# Patient Record
Sex: Male | Born: 1940 | Race: White | Hispanic: No | Marital: Married | Smoking: Never smoker
Health system: Southern US, Community
[De-identification: ages and names within clinical notes are randomized; demographics above are authoritative.]

## PROBLEM LIST (undated history)

## (undated) DIAGNOSIS — C801 Malignant (primary) neoplasm, unspecified: Secondary | ICD-10-CM

## (undated) DIAGNOSIS — C61 Malignant neoplasm of prostate: Secondary | ICD-10-CM

## (undated) DIAGNOSIS — N189 Chronic kidney disease, unspecified: Secondary | ICD-10-CM

## (undated) DIAGNOSIS — G4730 Sleep apnea, unspecified: Secondary | ICD-10-CM

## (undated) DIAGNOSIS — I499 Cardiac arrhythmia, unspecified: Secondary | ICD-10-CM

## (undated) DIAGNOSIS — C4490 Unspecified malignant neoplasm of skin, unspecified: Secondary | ICD-10-CM

## (undated) DIAGNOSIS — I4892 Unspecified atrial flutter: Secondary | ICD-10-CM

## (undated) DIAGNOSIS — I10 Essential (primary) hypertension: Secondary | ICD-10-CM

## (undated) HISTORY — PX: KNEE SURGERY: SHX244

## (undated) HISTORY — PX: CARDIOVERSION: SHX1299

## (undated) HISTORY — PX: CARDIAC CATHETERIZATION: SHX172

## (undated) HISTORY — PX: PROSTATE SURGERY: SHX751

---

## 2000-04-15 ENCOUNTER — Ambulatory Visit (HOSPITAL_COMMUNITY): Admission: RE | Admit: 2000-04-15 | Discharge: 2000-04-15 | Payer: Self-pay | Admitting: Urology

## 2000-04-15 ENCOUNTER — Encounter: Payer: Self-pay | Admitting: Urology

## 2000-07-22 ENCOUNTER — Ambulatory Visit (HOSPITAL_COMMUNITY): Admission: RE | Admit: 2000-07-22 | Discharge: 2000-07-22 | Payer: Self-pay | Admitting: Urology

## 2011-02-02 DIAGNOSIS — C61 Malignant neoplasm of prostate: Secondary | ICD-10-CM | POA: Insufficient documentation

## 2011-02-02 DIAGNOSIS — C4492 Squamous cell carcinoma of skin, unspecified: Secondary | ICD-10-CM | POA: Insufficient documentation

## 2011-02-02 DIAGNOSIS — C4491 Basal cell carcinoma of skin, unspecified: Secondary | ICD-10-CM | POA: Insufficient documentation

## 2011-06-22 DIAGNOSIS — M159 Polyosteoarthritis, unspecified: Secondary | ICD-10-CM | POA: Insufficient documentation

## 2012-04-26 DIAGNOSIS — R9431 Abnormal electrocardiogram [ECG] [EKG]: Secondary | ICD-10-CM | POA: Insufficient documentation

## 2015-04-24 DIAGNOSIS — R600 Localized edema: Secondary | ICD-10-CM | POA: Insufficient documentation

## 2015-06-24 DIAGNOSIS — K5730 Diverticulosis of large intestine without perforation or abscess without bleeding: Secondary | ICD-10-CM | POA: Insufficient documentation

## 2015-06-24 DIAGNOSIS — K635 Polyp of colon: Secondary | ICD-10-CM | POA: Insufficient documentation

## 2015-06-24 DIAGNOSIS — G4733 Obstructive sleep apnea (adult) (pediatric): Secondary | ICD-10-CM | POA: Insufficient documentation

## 2015-10-07 DIAGNOSIS — C44111 Basal cell carcinoma of skin of unspecified eyelid, including canthus: Secondary | ICD-10-CM | POA: Insufficient documentation

## 2016-01-19 DIAGNOSIS — F5101 Primary insomnia: Secondary | ICD-10-CM | POA: Insufficient documentation

## 2016-07-26 DIAGNOSIS — M5116 Intervertebral disc disorders with radiculopathy, lumbar region: Secondary | ICD-10-CM | POA: Insufficient documentation

## 2016-08-10 DIAGNOSIS — F419 Anxiety disorder, unspecified: Secondary | ICD-10-CM | POA: Insufficient documentation

## 2017-02-01 DIAGNOSIS — N183 Chronic kidney disease, stage 3 unspecified: Secondary | ICD-10-CM | POA: Insufficient documentation

## 2017-02-16 DIAGNOSIS — I4892 Unspecified atrial flutter: Secondary | ICD-10-CM | POA: Insufficient documentation

## 2017-03-29 DIAGNOSIS — I480 Paroxysmal atrial fibrillation: Secondary | ICD-10-CM | POA: Insufficient documentation

## 2017-04-08 DIAGNOSIS — D219 Benign neoplasm of connective and other soft tissue, unspecified: Secondary | ICD-10-CM | POA: Insufficient documentation

## 2017-04-08 DIAGNOSIS — H43819 Vitreous degeneration, unspecified eye: Secondary | ICD-10-CM | POA: Insufficient documentation

## 2017-11-24 DIAGNOSIS — S83242A Other tear of medial meniscus, current injury, left knee, initial encounter: Secondary | ICD-10-CM | POA: Insufficient documentation

## 2019-05-17 DIAGNOSIS — I493 Ventricular premature depolarization: Secondary | ICD-10-CM | POA: Insufficient documentation

## 2019-05-17 DIAGNOSIS — I517 Cardiomegaly: Secondary | ICD-10-CM | POA: Insufficient documentation

## 2019-06-27 DIAGNOSIS — N2889 Other specified disorders of kidney and ureter: Secondary | ICD-10-CM | POA: Insufficient documentation

## 2019-08-01 ENCOUNTER — Other Ambulatory Visit (HOSPITAL_COMMUNITY): Payer: Self-pay | Admitting: Urology

## 2019-08-01 ENCOUNTER — Other Ambulatory Visit: Payer: Self-pay | Admitting: Urology

## 2019-08-01 DIAGNOSIS — D49512 Neoplasm of unspecified behavior of left kidney: Secondary | ICD-10-CM

## 2019-08-01 DIAGNOSIS — C763 Malignant neoplasm of pelvis: Secondary | ICD-10-CM

## 2019-08-09 ENCOUNTER — Ambulatory Visit (HOSPITAL_COMMUNITY)
Admission: RE | Admit: 2019-08-09 | Discharge: 2019-08-09 | Disposition: A | Discharge status: Home / Self Care | Payer: Medicare Other | Source: Ambulatory Visit | Attending: Urology | Admitting: Urology

## 2019-08-09 ENCOUNTER — Other Ambulatory Visit: Payer: Self-pay

## 2019-08-09 DIAGNOSIS — D49512 Neoplasm of unspecified behavior of left kidney: Secondary | ICD-10-CM | POA: Insufficient documentation

## 2019-08-09 DIAGNOSIS — C763 Malignant neoplasm of pelvis: Secondary | ICD-10-CM

## 2019-08-09 MED ORDER — GADOBUTROL 1 MMOL/ML IV SOLN
8.0000 mL | Freq: Once | INTRAVENOUS | Status: AC | PRN
  Administered 2019-08-09: 19:00:00 8 mL via INTRAVENOUS

## 2019-08-20 ENCOUNTER — Other Ambulatory Visit (HOSPITAL_COMMUNITY): Payer: Self-pay | Admitting: Urology

## 2019-08-20 DIAGNOSIS — D3002 Benign neoplasm of left kidney: Secondary | ICD-10-CM

## 2019-08-21 ENCOUNTER — Other Ambulatory Visit (HOSPITAL_COMMUNITY): Payer: Self-pay | Admitting: Urology

## 2019-08-21 ENCOUNTER — Encounter (HOSPITAL_COMMUNITY): Payer: Self-pay

## 2019-08-21 DIAGNOSIS — D3002 Benign neoplasm of left kidney: Secondary | ICD-10-CM

## 2019-08-21 NOTE — Progress Notes (Signed)
Darrell Cook Male, 78 y.o., [birth year] MRN:  [id number] Phone:  [phone] (H) PCP:  Eber Hong, MD Primary Cvg:  Medicare/Medicare Part A And B Next Appt With Radiology ([facility]-CT 3) 08/29/2019 at 8:00 AM  RE: Biopsy Received: Today Message Contents  Arne Cleveland, MD  [name]    CT core L renal mass    [facility]   Previous Messages  ----- Message -----  From: Lenore Cordia  Sent: 08/20/2019  5:13 PM EST  To: Ir Procedure Requests  Subject: Biopsy                      Procedure Requested: CT or US Biopsy    Reason for Procedure: Percutaneous biopsy of left renal mass with Ultrasound or CT to evaluate for malignancy    Provider Requesting: Dr Raynelle Bring  Provider Telephone: [phone]    Other Info: Rad exams in Epic

## 2019-08-28 ENCOUNTER — Other Ambulatory Visit: Payer: Self-pay | Admitting: Radiology

## 2019-08-29 ENCOUNTER — Ambulatory Visit (HOSPITAL_COMMUNITY)
Admission: RE | Admit: 2019-08-29 | Discharge: 2019-08-29 | Disposition: A | Discharge status: Home / Self Care | Payer: Medicare Other | Source: Ambulatory Visit | Attending: Urology | Admitting: Urology

## 2019-08-29 ENCOUNTER — Other Ambulatory Visit: Payer: Self-pay

## 2019-08-29 ENCOUNTER — Encounter (HOSPITAL_COMMUNITY): Payer: Self-pay

## 2019-08-29 DIAGNOSIS — I10 Essential (primary) hypertension: Secondary | ICD-10-CM | POA: Diagnosis not present

## 2019-08-29 DIAGNOSIS — D3002 Benign neoplasm of left kidney: Secondary | ICD-10-CM

## 2019-08-29 DIAGNOSIS — I700 Atherosclerosis of aorta: Secondary | ICD-10-CM | POA: Insufficient documentation

## 2019-08-29 DIAGNOSIS — Z85828 Personal history of other malignant neoplasm of skin: Secondary | ICD-10-CM | POA: Diagnosis not present

## 2019-08-29 DIAGNOSIS — Z8546 Personal history of malignant neoplasm of prostate: Secondary | ICD-10-CM | POA: Diagnosis not present

## 2019-08-29 DIAGNOSIS — I4892 Unspecified atrial flutter: Secondary | ICD-10-CM | POA: Insufficient documentation

## 2019-08-29 DIAGNOSIS — G4730 Sleep apnea, unspecified: Secondary | ICD-10-CM | POA: Insufficient documentation

## 2019-08-29 DIAGNOSIS — C642 Malignant neoplasm of left kidney, except renal pelvis: Secondary | ICD-10-CM | POA: Insufficient documentation

## 2019-08-29 HISTORY — DX: Unspecified atrial flutter: I48.92

## 2019-08-29 HISTORY — DX: Malignant (primary) neoplasm, unspecified: C80.1

## 2019-08-29 HISTORY — DX: Malignant neoplasm of prostate: C61

## 2019-08-29 HISTORY — DX: Sleep apnea, unspecified: G47.30

## 2019-08-29 HISTORY — DX: Unspecified malignant neoplasm of skin, unspecified: C44.90

## 2019-08-29 HISTORY — DX: Essential (primary) hypertension: I10

## 2019-08-29 LAB — CBC
HCT: 45.7 % (ref 39.0–52.0)
Hemoglobin: 15.5 g/dL (ref 13.0–17.0)
MCH: 32.5 pg (ref 26.0–34.0)
MCHC: 33.9 g/dL (ref 30.0–36.0)
MCV: 95.8 fL (ref 80.0–100.0)
Platelets: 164 10*3/uL (ref 150–400)
RBC: 4.77 MIL/uL (ref 4.22–5.81)
RDW: 12.3 % (ref 11.5–15.5)
WBC: 6.3 10*3/uL (ref 4.0–10.5)
nRBC: 0 % (ref 0.0–0.2)

## 2019-08-29 LAB — PROTIME-INR
INR: 1 (ref 0.8–1.2)
Prothrombin Time: 13 seconds (ref 11.4–15.2)

## 2019-08-29 MED ORDER — MIDAZOLAM HCL 2 MG/2ML IJ SOLN
INTRAMUSCULAR | Status: AC
  Filled 2019-08-29: qty 2

## 2019-08-29 MED ORDER — SODIUM CHLORIDE 0.9 % IV SOLN: INTRAVENOUS | Status: DC

## 2019-08-29 MED ORDER — HYDROCODONE-ACETAMINOPHEN 5-325 MG PO TABS: 1.0000 | ORAL_TABLET | ORAL | Status: DC | PRN

## 2019-08-29 MED ORDER — GELATIN ABSORBABLE 12-7 MM EX MISC
CUTANEOUS | Status: AC
  Filled 2019-08-29: qty 1

## 2019-08-29 MED ORDER — LIDOCAINE HCL 1 % IJ SOLN
INTRAMUSCULAR | Status: AC
  Filled 2019-08-29: qty 20

## 2019-08-29 MED ORDER — SODIUM CHLORIDE 0.9 % IV SOLN
INTRAVENOUS | Status: AC | PRN
  Administered 2019-08-29: 10 mL/h via INTRAVENOUS

## 2019-08-29 MED ORDER — FENTANYL CITRATE (PF) 100 MCG/2ML IJ SOLN
INTRAMUSCULAR | Status: AC | PRN
  Administered 2019-08-29: 50 ug via INTRAVENOUS

## 2019-08-29 MED ORDER — FENTANYL CITRATE (PF) 100 MCG/2ML IJ SOLN
INTRAMUSCULAR | Status: AC
  Filled 2019-08-29: qty 4

## 2019-08-29 NOTE — H&P (Signed)
Chief Complaint: Patient was seen in consultation today for left renal mass biopsy at the request of [place]  Referring Physician(s): Borden,Lester  Supervising Physician: Arne Cleveland  Patient Status: [facility] - Out-pt  History of Present Illness: Darrell Cook is a 78 y.o. male   Retired Education officer, environmental Hx Prostate cancer 1995 Developed rib pain and was seen by Primary MD few months ago Imaging revealed incidental Left renal mass  MR 08/09/19: IMPRESSION: 1. 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 No tumor thrombus in the left renal vein or appreciable adenopathy. 2. Enhancing bony pelvic lesions are roughly stable in size compared to the pelvic MRI of 08/04/2016, and may represent residua of prior metastatic disease; given the prostatectomy, previous prostate cancer lesions are suggested. The lack of significant progression may indicate effectively treated disease, but these lesions do continue to mildly enhance. 3. Prostatectomy noted with urethral sphincter implant. 4. Mild cardiomegaly. 5. Numerous renal cysts of varying size and complexity, but without definite enhancement. Many of these are technically too small to characterize. 6. Descending and sigmoid colon diverticulosis. 7.  Aortic Atherosclerosis (ICD10-I70.0). 8. Lumbar spondylosis and degenerative disc disease.  Referred to Dr Windle Guard Scheduled now for left renal mass biopsy    Past Medical History:  Diagnosis Date  . Atrial flutter ([place])   . Cancer ([place])   . Hypertension   . Prostate CA ([place])   . Skin cancer   . Sleep apnea     Past Surgical History:  Procedure Laterality Date  . CARDIAC CATHETERIZATION    . CARDIOVERSION    . KNEE SURGERY    . PROSTATE SURGERY      Allergies: Other, Benzodiazepines, and Magnesium  Medications: Prior to Admission medications   Not on File     History reviewed. No pertinent family  history.  Social History   Socioeconomic History  . Marital status: Married    Spouse name: Not on file  . Number of children: Not on file  . Years of education: Not on file  . Highest education level: Not on file  Occupational History  . Not on file  Tobacco Use  . Smoking status: Never Smoker  . Smokeless tobacco: Never Used  Substance and Sexual Activity  . Alcohol use: Not Currently  . Drug use: Never  . Sexual activity: Not on file  Other Topics Concern  . Not on file  Social History Narrative  . Not on file   Social Determinants of Health   Financial Resource Strain:   . Difficulty of Paying Living Expenses: Not on file  Food Insecurity:   . Worried About Charity fundraiser in the Last Year: Not on file  . Ran Out of Food in the Last Year: Not on file  Transportation Needs:   . Lack of Transportation (Medical): Not on file  . Lack of Transportation (Non-Medical): Not on file  Physical Activity:   . Days of Exercise per Week: Not on file  . Minutes of Exercise per Session: Not on file  Stress:   . Feeling of Stress : Not on file  Social Connections:   . Frequency of Communication with Friends and Family: Not on file  . Frequency of Social Gatherings with Friends and Family: Not on file  . Attends Religious Services: Not on file  . Active Member of Clubs or Organizations: Not on file  . Attends Archivist Meetings: Not  on file  . Marital Status: Not on file    Review of Systems: A 12 point ROS discussed and pertinent positives are indicated in the HPI above.  All other systems are negative.  Review of Systems  Constitutional: Negative for activity change, fatigue and fever.  Respiratory: Negative for cough and shortness of breath.   Cardiovascular: Negative for chest pain.  Gastrointestinal: Negative for abdominal pain.  Musculoskeletal: Negative for back pain and gait problem.  Neurological: Negative for weakness.  Psychiatric/Behavioral:  Negative for behavioral problems and confusion.    Vital Signs: BP (!) 157/86   Pulse (!) 50   Temp 97.9 F (36.6 C) (Oral)   Resp 16   Ht 5\' 11"  (1.803 m)   Wt 190 lb (86.2 kg)   SpO2 100%   BMI 26.50 kg/m   Physical Exam Vitals reviewed.  Cardiovascular:     Rate and Rhythm: Normal rate and regular rhythm.     Heart sounds: Normal heart sounds.  Pulmonary:     Effort: Pulmonary effort is normal.     Breath sounds: Normal breath sounds.  Abdominal:     Palpations: Abdomen is soft.     Tenderness: There is no abdominal tenderness.  Musculoskeletal:        General: Normal range of motion.  Skin:    General: Skin is warm and dry.  Neurological:     Mental Status: He is alert and oriented to person, place, and time.  Psychiatric:        Mood and Affect: Mood normal.        Behavior: Behavior normal.        Thought Content: Thought content normal.        Judgment: Judgment normal.     Imaging: MR PELVIS W WO CONTRAST  Result Date: 08/10/2019 CLINICAL DATA:  Left renal malignancy. EXAM: MRI ABDOMEN AND PELVIS WITHOUT AND WITH CONTRAST TECHNIQUE: Multiplanar multisequence MR imaging of the abdomen and pelvis was performed both before and after the administration of intravenous contrast. CONTRAST:  70mL GADAVIST GADOBUTROL 1 MMOL/ML IV SOLN COMPARISON:  Outside CT abdomen 06/21/2019 and outside MRI pelvis from 08/04/2016 FINDINGS: COMBINED FINDINGS FOR BOTH MR ABDOMEN AND PELVIS Despite efforts by the technologist and patient, motion artifact is present on today's exam and could not be eliminated. This reduces exam sensitivity and specificity. Lower chest: Mild cardiomegaly. Hepatobiliary: Unremarkable Pancreas:  Unremarkable Spleen:  Unremarkable Adrenals/Urinary Tract:  The adrenal glands appear unremarkable. In the left mid kidney a 4.0 by 3.6 by 4.4 cm solid enhancing mass has mixed T2 signal characteristics and mixed T1 signal characteristics. No internal fat density within  this lesion on prior CT or on today's MRI. No appreciable tumor thrombus in the left renal vein. Numerous small additional cysts of varying complexity are scattered throughout both kidneys. Some of these have high precontrast T1 signal characteristics and many are technically too small to characterize, but no other specific worrisome enhancing lesions are identified. Metal artifact in the vicinity of the small caliber urinary bladder, correlate with operative history. Stomach/Bowel: Descending and sigmoid colon diverticulosis. Vascular/Lymphatic: Aortoiliac atherosclerotic vascular disease. No pathologic adenopathy is identified. Reproductive: Prostatectomy. Urethral sphincter implant noted with reservoir in the right lower quadrant. Other:  No supplemental non-categorized findings. Musculoskeletal: An enhancing right iliac bone lesion measuring 1.5 cm in diameter on image 16/6 previously measured 1.3 cm in diameter on 08/04/2016. Enhancing 4.8 by 1.9 cm left sacral T2 hyperintense lesion on image 10/6 appears roughly stable.  An enhancing 2.0 by 1.1 cm T2 hyperintense lesion of the left proximal femur on image 36/6 of the pelvic MRI previously measured 1.9 by 1.4 cm. Small cystic lesions along the margins of the femoral heads noted. Dextroconvex lumbar scoliosis with spondylosis and degenerative disc disease. Degenerative endplate findings eccentric to the left at the L2-3 level. IMPRESSION: 1. 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 No tumor thrombus in the left renal vein or appreciable adenopathy. 2. Enhancing bony pelvic lesions are roughly stable in size compared to the pelvic MRI of 08/04/2016, and may represent residua of prior metastatic disease; given the prostatectomy, previous prostate cancer lesions are suggested. The lack of significant progression may indicate effectively treated disease, but these lesions do continue to mildly enhance. 3. Prostatectomy  noted with urethral sphincter implant. 4. Mild cardiomegaly. 5. Numerous renal cysts of varying size and complexity, but without definite enhancement. Many of these are technically too small to characterize. 6. Descending and sigmoid colon diverticulosis. 7.  Aortic Atherosclerosis (ICD10-I70.0). 8. Lumbar spondylosis and degenerative disc disease. Electronically Signed   By: Van Clines M.D.   On: 08/10/2019 08:17   MR ABDOMEN WWO CONTRAST  Result Date: 08/10/2019 CLINICAL DATA:  Left renal malignancy. EXAM: MRI ABDOMEN AND PELVIS WITHOUT AND WITH CONTRAST TECHNIQUE: Multiplanar multisequence MR imaging of the abdomen and pelvis was performed both before and after the administration of intravenous contrast. CONTRAST:  68mL GADAVIST GADOBUTROL 1 MMOL/ML IV SOLN COMPARISON:  Outside CT abdomen 06/21/2019 and outside MRI pelvis from 08/04/2016 FINDINGS: COMBINED FINDINGS FOR BOTH MR ABDOMEN AND PELVIS Despite efforts by the technologist and patient, motion artifact is present on today's exam and could not be eliminated. This reduces exam sensitivity and specificity. Lower chest: Mild cardiomegaly. Hepatobiliary: Unremarkable Pancreas:  Unremarkable Spleen:  Unremarkable Adrenals/Urinary Tract:  The adrenal glands appear unremarkable. In the left mid kidney a 4.0 by 3.6 by 4.4 cm solid enhancing mass has mixed T2 signal characteristics and mixed T1 signal characteristics. No internal fat density within this lesion on prior CT or on today's MRI. No appreciable tumor thrombus in the left renal vein. Numerous small additional cysts of varying complexity are scattered throughout both kidneys. Some of these have high precontrast T1 signal characteristics and many are technically too small to characterize, but no other specific worrisome enhancing lesions are identified. Metal artifact in the vicinity of the small caliber urinary bladder, correlate with operative history. Stomach/Bowel: Descending and sigmoid colon  diverticulosis. Vascular/Lymphatic: Aortoiliac atherosclerotic vascular disease. No pathologic adenopathy is identified. Reproductive: Prostatectomy. Urethral sphincter implant noted with reservoir in the right lower quadrant. Other:  No supplemental non-categorized findings. Musculoskeletal: An enhancing right iliac bone lesion measuring 1.5 cm in diameter on image 16/6 previously measured 1.3 cm in diameter on 08/04/2016. Enhancing 4.8 by 1.9 cm left sacral T2 hyperintense lesion on image 10/6 appears roughly stable. An enhancing 2.0 by 1.1 cm T2 hyperintense lesion of the left proximal femur on image 36/6 of the pelvic MRI previously measured 1.9 by 1.4 cm. Small cystic lesions along the margins of the femoral heads noted. Dextroconvex lumbar scoliosis with spondylosis and degenerative disc disease. Degenerative endplate findings eccentric to the left at the L2-3 level. IMPRESSION: 1. 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 No tumor thrombus in the left renal vein or appreciable adenopathy. 2. Enhancing bony pelvic lesions are roughly stable in size compared to  the pelvic MRI of 08/04/2016, and may represent residua of prior metastatic disease; given the prostatectomy, previous prostate cancer lesions are suggested. The lack of significant progression may indicate effectively treated disease, but these lesions do continue to mildly enhance. 3. Prostatectomy noted with urethral sphincter implant. 4. Mild cardiomegaly. 5. Numerous renal cysts of varying size and complexity, but without definite enhancement. Many of these are technically too small to characterize. 6. Descending and sigmoid colon diverticulosis. 7.  Aortic Atherosclerosis (ICD10-I70.0). 8. Lumbar spondylosis and degenerative disc disease. Electronically Signed   By: Van Clines M.D.   On: 08/10/2019 08:17    Labs:  CBC: No results for input(s): WBC, HGB, HCT, PLT in the last 8760  hours.  COAGS: No results for input(s): INR, APTT in the last 8760 hours.  BMP: No results for input(s): NA, K, CL, CO2, GLUCOSE, BUN, CALCIUM, CREATININE, GFRNONAA, GFRAA in the last 8760 hours.  Invalid input(s): CMP  LIVER FUNCTION TESTS: No results for input(s): BILITOT, AST, ALT, ALKPHOS, PROT, ALBUMIN in the last 8760 hours.  TUMOR MARKERS: No results for input(s): AFPTM, CEA, CA199, CHROMGRNA in the last 8760 hours.  Assessment and Plan:  Left renal mass Scheduled for biopsy of same Risks and benefits of left renal mass biopsy was discussed with the patient and/or patient's family including, but not limited to bleeding, infection, damage to adjacent structures or low yield requiring additional tests.  All of the questions were answered and there is agreement to proceed. Consent signed and in chart.   Thank you for this interesting consult.  I greatly enjoyed meeting Darrell Cook and look forward to participating in their care.  A copy of this report was sent to the requesting provider on this date.  Electronically Signed: Lavonia Drafts, PA-C 08/29/2019, 6:54 AM   I spent a total of  30 Minutes   in face to face in clinical consultation, greater than 50% of which was counseling/coordinating care for left renal mass biopsy

## 2019-08-29 NOTE — Discharge Instructions (Signed)
Percutaneous Kidney Biopsy, Care After This sheet gives you information about how to care for yourself after your procedure. Your health care provider may also give you more specific instructions. If you have problems or questions, contact your health care provider. What can I expect after the procedure? After the procedure, it is common to have: Pain or soreness near the area where the needle went through your skin (biopsy site). Bright pink or cloudy urine for 24 hours after the procedure. Follow these instructions at home: Activity Return to your normal activities as told by your health care provider. Ask your health care provider what activities are safe for you. Do not drive for 24 hours if you were given a medicine to help you relax (sedative). Do not lift anything that is heavier than 10 lb (4.5 kg) until your health care provider tells you that it is safe. Avoid activities that take a lot of effort (are strenuous) until your health care provider approves. Most people will have to wait 2 weeks before returning to activities such as exercise or sexual intercourse. General instructions  Take over-the-counter and prescription medicines only as told by your health care provider. You may eat and drink after your procedure. Follow instructions from your health care provider about eating or drinking restrictions. Check your biopsy site every day for signs of infection. Check for: More redness, swelling, or pain. More fluid or blood. Warmth. Pus or a bad smell. Keep all follow-up visits as told by your health care provider. This is important. Contact a health care provider if: You have more redness, swelling, or pain around your biopsy site. You have more fluid or blood coming from your biopsy site. Your biopsy site feels warm to the touch. You have pus or a bad smell coming from your biopsy site. You have blood in your urine more than 24 hours after your procedure. Get help right away  if: You have dark red or brown urine. You have a fever. You are unable to urinate. You feel burning when you urinate. You feel faint. You have severe pain in your abdomen or side. This information is not intended to replace advice given to you by your health care provider. Make sure you discuss any questions you have with your health care provider. Document Released: 04/25/2013 Document Revised: 08/05/2017 Document Reviewed: 06/04/2016 Elsevier Patient Education  2020 [place]. Percutaneous Kidney Biopsy  A kidney biopsy is a procedure to remove small pieces of tissue from a kidney. In a percutaneous biopsy, the tissue is removed using a needle that is inserted through the skin. This procedure is done so that the tissue can be examined under a microscope and checked for disease or infection. Tell a health care provider about:  Any allergies you have.  All medicines you are taking, including vitamins, herbs, eye drops, creams, and over-the-counter medicines.  Any problems you or family members have had with anesthetic medicines.  Any blood disorders you have.  Any surgeries you have had.  Any medical conditions you have.  Whether you are pregnant or may be pregnant. What are the risks? Generally, this is a safe procedure. However, problems may occur, including:  Infection.  Bleeding.  Allergic reactions to medicines.  Damage to other structures or organs.  Swelling from a collection of clotted blood outside a blood vessel (hematoma).  Blood in the urine (hematuria). What happens before the procedure?  Follow instructions from your health care provider about eating or drinking restrictions.  Ask your  health care provider about: ? Changing or stopping your regular medicines. This is especially important if you are taking diabetes medicines or blood thinners. ? Taking medicines such as aspirin and ibuprofen. These medicines can thin your blood. Do not take these  medicines before your procedure if your health care provider instructs you not to.  You may be given antibiotic medicine to help prevent infection.  You will have blood and urine samples taken. This is to make sure that you do not have a condition where you should not have a biopsy.  Plan to have someone take you home from the hospital or clinic.  Ask your health care provider how your biopsy site will be marked or identified. What happens during the procedure?  To lower your risk of infection: ? Your health care team will wash or sanitize their hands. ? Your skin will be washed with soap.  An IV tube will be inserted into one of your veins.  You will be given one or more of the following: ? A medicine to help you relax (sedative). ? A medicine to numb the area (local anesthetic).  You will lie on your abdomen. A firm pillow will be placed under your body to help push the kidneys closer to the surface of the skin. If you have a transplanted kidney, you will lie on your back.  The health care provider will mark the area where the needle will enter your skin.  An imaging test--such as an ultrasound, X-ray, CT scan, or MRI--will be used to locate the kidney. These images will also help the health care provider to guide the biopsy needle into the kidney.  You will be asked to hold your breath and stay still while the health care provider inserts the needle and removes the kidney tissue. ? You will need to hold your breath and stay still for 30-45 seconds. ? During the biopsy, you may hear a popping sound from the needle. ? You may also feel some pressure from the area where the needle is being inserted.  The needle may be inserted and removed 3 or 4 times to make sure that enough tissue is taken for testing.  A bandage (dressing) may be placed over the spot where the needle entered your skin (biopsy site). The procedure may vary among health care providers and hospitals. What happens  after the procedure?  Your blood pressure, heart rate, breathing rate, and blood oxygen level will be monitored until the medicines you were given have worn off.  You will need to lie on your back for 6-8 hours.  You may have some pain or soreness near the biopsy site.  You may have pink or cloudy urine from small amounts of blood. This is normal.  You may have grogginess or fatigue if you were given a sedative.  Do not drive for 24 hours if you were given a sedative.  It is up to you to get the results of your procedure. Ask your health care provider, or the department performing the procedure, when your results will be ready. This information is not intended to replace advice given to you by your health care provider. Make sure you discuss any questions you have with your health care provider. Document Released: 07/03/2004 Document Revised: 08/05/2017 Document Reviewed: 06/04/2016 Elsevier Patient Education  [place]. Moderate Conscious Sedation, Adult, Care After These instructions provide you with information about caring for yourself after your procedure. Your health care provider may also give  you more specific instructions. Your treatment has been planned according to current medical practices, but problems sometimes occur. Call your health care provider if you have any problems or questions after your procedure. What can I expect after the procedure? After your procedure, it is common:  To feel sleepy for several hours.  To feel clumsy and have poor balance for several hours.  To have poor judgment for several hours.  To vomit if you eat too soon. Follow these instructions at home: For at least 24 hours after the procedure:   Do not: ? Participate in activities where you could fall or become injured. ? Drive. ? Use heavy machinery. ? Drink alcohol. ? Take sleeping pills or medicines that cause drowsiness. ? Make important decisions or sign legal  documents. ? Take care of children on your own.  Rest. Eating and drinking  Follow the diet recommended by your health care provider.  If you vomit: ? Drink water, juice, or soup when you can drink without vomiting. ? Make sure you have little or no nausea before eating solid foods. General instructions  Have a responsible adult stay with you until you are awake and alert.  Take over-the-counter and prescription medicines only as told by your health care provider.  If you smoke, do not smoke without supervision.  Keep all follow-up visits as told by your health care provider. This is important. Contact a health care provider if:  You keep feeling nauseous or you keep vomiting.  You feel light-headed.  You develop a rash.  You have a fever. Get help right away if:  You have trouble breathing. This information is not intended to replace advice given to you by your health care provider. Make sure you discuss any questions you have with your health care provider. Document Released: 06/13/2013 Document Revised: 08/05/2017 Document Reviewed: 12/13/2015 Elsevier Patient Education  2020 [employer].

## 2019-08-29 NOTE — Procedures (Signed)
  Procedure: CT core bx L renal mass   EBL:   minimal Complications:  none immediate  See full dictation in [facility].  Dillard Cannon MD Main # [phone] Pager  [phone]

## 2019-08-30 ENCOUNTER — Other Ambulatory Visit: Payer: Self-pay

## 2019-09-04 LAB — SURGICAL PATHOLOGY

## 2019-10-01 ENCOUNTER — Other Ambulatory Visit (HOSPITAL_COMMUNITY): Payer: Medicare Other

## 2019-10-04 NOTE — Progress Notes (Signed)
Can you please place some orders for the upcoming surgery.Pt. has an appointment for PST phone interview and labs on 10/05/19. Thank you.

## 2019-10-04 NOTE — Patient Instructions (Signed)
DUE TO COVID-19 ONLY ONE VISITOR IS ALLOWED TO COME WITH YOU AND STAY IN THE WAITING ROOM ONLY DURING PRE OP AND PROCEDURE DAY OF SURGERY. THE 1 VISITOR MAY VISIT WITH YOU AFTER SURGERY IN YOUR PRIVATE ROOM DURING VISITING HOURS ONLY!  YOU NEED TO HAVE A COVID 19 TEST ON: 10/08/19 @ 10:40 am, THIS TEST MUST BE DONE BEFORE SURGERY, COME  [place], [place] , [address].  ([place]) ONCE YOUR COVID TEST IS COMPLETED, PLEASE BEGIN THE QUARANTINE INSTRUCTIONS AS OUTLINED IN YOUR HANDOUT.                Darrell Cook     Your procedure is scheduled on: 10/11/19   Report to [facility] Main  Entrance   Report to admitting at: 9:30 AM     Call this number if you have problems the morning of surgery [phone]    Remember: Do not eat food or drink liquids :After Midnight.   BRUSH YOUR TEETH MORNING OF SURGERY AND RINSE YOUR MOUTH OUT, NO CHEWING GUM CANDY OR MINTS.     Take these medicines the morning of surgery with A SIP OF WATER: omeprazole,sotolol,xanax as needed.  DO NOT TAKE ANY DIABETIC MEDICATIONS DAY OF YOUR SURGERY                               You may not have any metal on your body including hair pins and              piercings  Do not wear jewelry, lotions, powders or perfumes, deodorant             Men may shave face and neck.   Do not bring valuables to the hospital. [place].  Contacts, dentures or bridgework may not be worn into surgery.  Leave suitcase in the car. After surgery it may be brought to your room.     Patients discharged the day of surgery will not be allowed to drive home. IF YOU ARE HAVING SURGERY AND GOING HOME THE SAME DAY, YOU MUST HAVE AN ADULT TO DRIVE YOU HOME AND BE WITH YOU FOR 24 HOURS. YOU MAY GO HOME BY TAXI OR UBER OR ORTHERWISE, BUT AN ADULT MUST ACCOMPANY YOU HOME AND STAY WITH YOU FOR 24 HOURS.  Name and phone number of your driver:  Special  Instructions: N/A              Please read over the following fact sheets you were given: _____________________________________________________________________             [facility] - Preparing for Surgery Before surgery, you can play an important role.  Because skin is not sterile, your skin needs to be as free of germs as possible.  You can reduce the number of germs on your skin by washing with CHG (chlorahexidine gluconate) soap before surgery.  CHG is an antiseptic cleaner which kills germs and bonds with the skin to continue killing germs even after washing. Please DO NOT use if you have an allergy to CHG or antibacterial soaps.  If your skin becomes reddened/irritated stop using the CHG and inform your nurse when you arrive at Short Stay. Do not shave (including legs and underarms) for at least 48 hours prior to the first CHG  shower.  You may shave your face/neck. Please follow these instructions carefully:  1.  Shower with CHG Soap the night before surgery and the  morning of Surgery.  2.  If you choose to wash your hair, wash your hair first as usual with your  normal  shampoo.  3.  After you shampoo, rinse your hair and body thoroughly to remove the  shampoo.                           4.  Use CHG as you would any other liquid soap.  You can apply chg directly  to the skin and wash                       Gently with a scrungie or clean washcloth.  5.  Apply the CHG Soap to your body ONLY FROM THE NECK DOWN.   Do not use on face/ open                           Wound or open sores. Avoid contact with eyes, ears mouth and genitals (private parts).                       Wash face,  Genitals (private parts) with your normal soap.             6.  Wash thoroughly, paying special attention to the area where your surgery  will be performed.  7.  Thoroughly rinse your body with warm water from the neck down.  8.  DO NOT shower/wash with your normal soap after using and rinsing off  the CHG  Soap.                9.  Pat yourself dry with a clean towel.            10.  Wear clean pajamas.            11.  Place clean sheets on your bed the night of your first shower and do not  sleep with pets. Day of Surgery : Do not apply any lotions/deodorants the morning of surgery.  Please wear clean clothes to the hospital/surgery center.  FAILURE TO FOLLOW THESE INSTRUCTIONS MAY RESULT IN THE CANCELLATION OF YOUR SURGERY PATIENT SIGNATURE_________________________________  NURSE SIGNATURE__________________________________  ________________________________________________________________________

## 2019-10-05 ENCOUNTER — Encounter (HOSPITAL_COMMUNITY): Payer: Self-pay

## 2019-10-05 ENCOUNTER — Other Ambulatory Visit: Payer: Self-pay

## 2019-10-05 ENCOUNTER — Encounter (HOSPITAL_COMMUNITY)
Admission: RE | Admit: 2019-10-05 | Discharge: 2019-10-05 | Disposition: A | Discharge status: Home / Self Care | Payer: Medicare Other | Source: Ambulatory Visit | Attending: Urology | Admitting: Urology

## 2019-10-05 DIAGNOSIS — Z7982 Long term (current) use of aspirin: Secondary | ICD-10-CM | POA: Diagnosis not present

## 2019-10-05 DIAGNOSIS — N189 Chronic kidney disease, unspecified: Secondary | ICD-10-CM | POA: Diagnosis not present

## 2019-10-05 DIAGNOSIS — I129 Hypertensive chronic kidney disease with stage 1 through stage 4 chronic kidney disease, or unspecified chronic kidney disease: Secondary | ICD-10-CM | POA: Diagnosis not present

## 2019-10-05 DIAGNOSIS — Z79899 Other long term (current) drug therapy: Secondary | ICD-10-CM | POA: Insufficient documentation

## 2019-10-05 DIAGNOSIS — I4892 Unspecified atrial flutter: Secondary | ICD-10-CM | POA: Insufficient documentation

## 2019-10-05 DIAGNOSIS — C642 Malignant neoplasm of left kidney, except renal pelvis: Secondary | ICD-10-CM | POA: Diagnosis not present

## 2019-10-05 DIAGNOSIS — Z8546 Personal history of malignant neoplasm of prostate: Secondary | ICD-10-CM | POA: Insufficient documentation

## 2019-10-05 DIAGNOSIS — G4730 Sleep apnea, unspecified: Secondary | ICD-10-CM | POA: Diagnosis not present

## 2019-10-05 DIAGNOSIS — Z01818 Encounter for other preprocedural examination: Secondary | ICD-10-CM | POA: Insufficient documentation

## 2019-10-05 DIAGNOSIS — Z85828 Personal history of other malignant neoplasm of skin: Secondary | ICD-10-CM | POA: Insufficient documentation

## 2019-10-05 HISTORY — DX: Cardiac arrhythmia, unspecified: I49.9

## 2019-10-05 HISTORY — DX: Chronic kidney disease, unspecified: N18.9

## 2019-10-05 NOTE — Progress Notes (Addendum)
PCP - Eber Hong. LOV: 09/2019 Cardiologist - Laymond Purser. Clearance. 08/17/19. EPIC  Chest x-ray -  EKG - 08/17/19 Stress Test -  ECHO - 08/17/19 Cardiac Cath -   Sleep Study - yes CPAP - yes  Fasting Blood Sugar -  Checks Blood Sugar _____ times a day  Blood Thinner Instructions: Aspirin Instructions: Last Dose:  Anesthesia review:   Patient denies shortness of breath, fever, cough and chest pain at PAT appointment   Patient verbalized understanding of instructions that were given to them at the PAT appointment. Patient was also instructed that they will need to review over the PAT instructions again at home before surgery.

## 2019-10-08 ENCOUNTER — Other Ambulatory Visit (HOSPITAL_COMMUNITY)
Admission: RE | Admit: 2019-10-08 | Discharge: 2019-10-08 | Disposition: A | Discharge status: Home / Self Care | Payer: Medicare Other | Source: Ambulatory Visit | Attending: Urology | Admitting: Urology

## 2019-10-08 ENCOUNTER — Other Ambulatory Visit: Payer: Self-pay

## 2019-10-08 ENCOUNTER — Encounter (HOSPITAL_COMMUNITY)
Admission: RE | Admit: 2019-10-08 | Discharge: 2019-10-08 | Disposition: A | Discharge status: Home / Self Care | Payer: Medicare Other | Source: Ambulatory Visit | Attending: Urology | Admitting: Urology

## 2019-10-08 DIAGNOSIS — Z01818 Encounter for other preprocedural examination: Secondary | ICD-10-CM | POA: Diagnosis not present

## 2019-10-08 LAB — CBC
HCT: 49 % (ref 39.0–52.0)
Hemoglobin: 15.9 g/dL (ref 13.0–17.0)
MCH: 31.5 pg (ref 26.0–34.0)
MCHC: 32.4 g/dL (ref 30.0–36.0)
MCV: 97.2 fL (ref 80.0–100.0)
Platelets: 184 10*3/uL (ref 150–400)
RBC: 5.04 MIL/uL (ref 4.22–5.81)
RDW: 12.6 % (ref 11.5–15.5)
WBC: 10.6 10*3/uL — ABNORMAL HIGH (ref 4.0–10.5)
nRBC: 0 % (ref 0.0–0.2)

## 2019-10-08 LAB — BASIC METABOLIC PANEL
Anion gap: 11 (ref 5–15)
BUN: 29 mg/dL — ABNORMAL HIGH (ref 8–23)
CO2: 29 mmol/L (ref 22–32)
Calcium: 9.6 mg/dL (ref 8.9–10.3)
Chloride: 99 mmol/L (ref 98–111)
Creatinine, Ser: 1.64 mg/dL — ABNORMAL HIGH (ref 0.61–1.24)
GFR calc Af Amer: 45 mL/min — ABNORMAL LOW (ref >60)
GFR calc non Af Amer: 39 mL/min — ABNORMAL LOW (ref >60)
Glucose, Bld: 93 mg/dL (ref 70–99)
Potassium: 4.5 mmol/L (ref 3.5–5.1)
Sodium: 139 mmol/L (ref 135–145)

## 2019-10-08 LAB — SARS CORONAVIRUS 2 (TAT 6-24 HRS): SARS Coronavirus 2: NEGATIVE

## 2019-10-10 NOTE — Progress Notes (Signed)
Anesthesia Chart Review   Case: [id number] Date/Time: 10/11/19 1115   Procedure: LAPAROSCOPIC NEPHRECTOMY (Left ) - ONLY NEEDS 210 MIN FOR PROCEDURE   Anesthesia type: General   Pre-op diagnosis: LEFT RENAL CELL CARCINOMA   Location: [facility] ROOM 03 / [facility]   Surgeons: Raynelle Bring, MD      DISCUSSION:79 y.o. never smoker with h/o HTN, sleep apnea, atrial flutter, prostate CA, OSA, CKD, left renal cell carcinoma scheduled for above procedure 10/11/19 with Dr. Raynelle Bring.   Cleared by cardiologist, Dr. Vernon Prey, 08/17/2019.  Per OV note, "Patient is due for left renal biopsy and possible surgery.  This is cancer and hence urgent.  Patient has normal cardia catheterization in 2018, echocardiogram in 2020 revealed at least moderate left ventricular hypertrophy and mild to moderate mitral regurgitation.  Patient is not on anticoagulation.  He is in sinus rhythm on sotalol therapy.  He has no new cardiac symptoms.  Patient will be considered to moderate cardiovascular risk.  Aspirin can be discontinued for required duration.  Sotalol should be continued perioperatively but should he develop atrial fibrillation or flutter with rapid ventricular rate, he may be better treated with amiodarone in the perioperative period."  Echo results requested.    Anticipate pt can proceed with planned procedure barring acute status change.   VS: BP (!) 166/97   Pulse 61   Temp 37 C (Oral)   Resp 18   Ht 5\' 11"  (1.803 m)   Wt 87.8 kg   SpO2 100%   BMI 26.99 kg/m   PROVIDERS: Eber Hong, MD is PCP last seen 06/27/2019  Laymond Purser, MD is Cardiologist with [facility] LABS: Labs reviewed: Acceptable for surgery. (all labs ordered are listed, but only abnormal results are displayed)  Labs Reviewed  BASIC METABOLIC PANEL - Abnormal; Notable for the following components:      Result Value   BUN 29 (*)    Creatinine, Ser 1.64 (*)    GFR calc non Af Amer 39 (*)    GFR calc Af Amer 45 (*)     All other components within normal limits  CBC - Abnormal; Notable for the following components:   WBC 10.6 (*)    All other components within normal limits     IMAGES:   EKG: 10/08/19 Rate 59 bpm Sinus bradycardia with 1st degree A-V block with Premature atrial complexes Left axis deviation Anterior infarct , age undetermined ST & T wave abnormality, consider lateral ischemia Abnormal ECG QS new in V3 sincle last tracing  CV:  Past Medical History:  Diagnosis Date  . Atrial flutter ([place])   . Cancer ([place])   . Chronic kidney disease   . Dysrhythmia    aflutter,pvc's  . Hypertension   . Prostate CA ([place])   . Skin cancer   . Sleep apnea     Past Surgical History:  Procedure Laterality Date  . CARDIAC CATHETERIZATION    . CARDIOVERSION    . KNEE SURGERY    . PROSTATE SURGERY      MEDICATIONS: . ALPRAZolam (XANAX) 1 MG tablet  . aspirin 81 MG EC tablet  . hydrochlorothiazide (MICROZIDE) 12.5 MG capsule  . HYDROcodone-acetaminophen (NORCO) 7.5-325 MG tablet  . imiquimod (ALDARA) 5 % cream  . irbesartan (AVAPRO) 150 MG tablet  . omeprazole (PRILOSEC) 20 MG capsule  . sotalol (BETAPACE) 80 MG tablet  . tiZANidine (ZANAFLEX) 4 MG tablet  . zolpidem (AMBIEN) 10 MG tablet   No current  facility-administered medications for this encounter.    Ma[facility] Pre-Surgical Testing [phone] 10/10/19  11:20 AM

## 2019-10-10 NOTE — H&P (Signed)
Office Visit Report     10/02/2019   --------------------------------------------------------------------------------   Darrell Cook  MRN: [id number]  DOB: 19-Jul-1941, 79 year old Male  SSN: [id number]   PRIMARY CARE:  Eber Hong  REFERRING:  Eber Hong  PROVIDER:  Raynelle Bring, M.D.  LOCATION:  [facility] [phone]     --------------------------------------------------------------------------------   CC/HPI: CC: Left renal cell carcinoma   Primary care physician: Dr. 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 He developed the acute onset of right-sided rib pain last fall. This remained consistent and sharp and prompted a CT scan of the chest, abdomen, and pelvis on 06/21/2019. This revealed no significant etiology for his pain symptoms but did incidentally detected a 3.8 cm solid central mass in the interpolar region of the left kidney concerning for possible renal cell carcinoma. No regional lymphadenopathy, renal vein invasion, or evidence of metastatic disease to the abdomen or chest was noted. Notably, he did have an MRI of the pelvis in 2017 that incidentally detected new enlarging bone lesions in the pelvis and proximal femurs with many of these lesions being lytic and concerning for possible malignancy. He does have a history of prostate cancer dating back to 1995 when he was treated with a retropubic radical prostatectomy. His PSA has been undetectable since then including as recently as this year. He apparently underwent a brief evaluation to rule out prostate cancer metastases although never underwent a biopsy or further evaluation of these bone lesions. To his knowledge, he had not had repeat imaging of the pelvis or lower extremities with MR imaging. His CT scan did not suggest bony abnormalities. He underwent a repeat MRI of the pelvis that  demonstrated no significant change in his pelvic lesions and his images were reviewed in the GU tumor board and felt to most likely be non-malignant based on their stability since 2017. 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 He has elected to undergo definitive treatment with a laparoscopic nephrectomy after discussion on the phone. His initial serum Cr was 1.4 but decreased to 1.1 when checked at our office. He has undergone a cardiac risk assessment by his cardiologist. He is at moderate risk but his atrial fibrillation and premature depolarization has been well controlled with sotalol and he has remained in sinus rhythm. It was recommended that he be considered for amiodorone if he develops atrial fibrillation perioperatively.   He has been completely asymptomatic and denies hematuria, weight loss, night sweats, or other systemic complaints. There is no family history of renal cell carcinoma. He does have a strong family history of prostate cancer. He does have significant stress incontinence related to his retropubic prostatectomy and does utilize a condom catheter routinely.   Family history of kidney cancer: None.  Family history of ESRD: None.   Imaging: 06/21/19  Side of renal neoplasm: Left  Size of renal neoplasm: 3.8 cm  Location of renal neoplasm: Cental left kidney  Exophytic or endophytic: Endophytic   Regional lymphadenopathy: None.  Adrenal masses: None.  Renal vein/IVC involvement: No.  Metastatic disease to the abdomen: No.   Chest imaging: CT scan - negative for metastatic disease  LFTs: Normal.   Baseline renal function: Cr 1.1, eGFR > 60 ml/min     ALLERGIES: Vicryl    MEDICATIONS: Omeprazole  Irbesartan  Sotalol     GU PSH: None   NON-GU PSH: Remove Tonsils And Adenoids     GU PMH: Left renal neoplasm - 07/27/2019    NON-GU PMH: Acute gastric ulcer with  hemorrhage Anxiety Arrhythmia Arthritis GERD Heart disease, unspecified Hypertension Sleep Apnea Unspecified atrial flutter    FAMILY HISTORY: None    Notes: 2 daughters   SOCIAL HISTORY: Marital Status: Married Preferred Language: English; Ethnicity: Not Hispanic Or Latino; Race: White Current Smoking Status: Patient has never smoked.   Tobacco Use Assessment Completed: Used Tobacco in last 30 days? Does not drink anymore.  Does not drink caffeine.    REVIEW OF SYSTEMS:    GU Review Male:   Patient denies frequent urination, hard to postpone urination, burning/ pain with urination, get up at night to urinate, leakage of urine, stream starts and stops, trouble starting your streams, and have to strain to urinate .  Gastrointestinal (Lower):   Patient denies diarrhea and constipation.  Gastrointestinal (Upper):   Patient denies nausea and vomiting.  Constitutional:   Patient denies fever, night sweats, weight loss, and fatigue.  Skin:   Patient denies skin rash/ lesion and itching.  Eyes:   Patient denies blurred vision and double vision.  Ears/ Nose/ Throat:   Patient denies sore throat and sinus problems.  Hematologic/Lymphatic:   Patient denies swollen glands and easy bruising.  Cardiovascular:   Patient denies leg swelling and chest pains.  Respiratory:   Patient denies cough and shortness of breath.  Endocrine:   Patient denies excessive thirst.  Musculoskeletal:   Patient denies back pain and joint pain.  Neurological:   Patient denies headaches and dizziness.  Psychologic:   Patient denies depression and anxiety.   VITAL SIGNS:      10/02/2019 09:20 AM  Weight 195 lb / 88.45 kg  Height 71 in / 180.34 cm  BP 167/91 mmHg  Pulse 54 /min  Temperature 97.3 F / 36.2 C  BMI 27.2 kg/m   MULTI-SYSTEM PHYSICAL EXAMINATION:    Constitutional: Well-nourished. No physical deformities. Normally developed. Good grooming.  Neck: Neck symmetrical, not swollen. Normal tracheal  position.  Respiratory: No labored breathing, no use of accessory muscles. Clear bilaterally.  Cardiovascular: Normal temperature, normal extremity pulses, no swelling, no varicosities. Regular rate and rhythm today.  Lymphatic: No enlargement of neck, axillae, groin.  Skin: No paleness, no jaundice, no cyanosis. No lesion, no ulcer, no rash.  Neurologic / Psychiatric: Oriented to time, oriented to place, oriented to person. No depression, no anxiety, no agitation.  Gastrointestinal: No mass, no tenderness, no rigidity, non obese abdomen. Well-healed lower midline incision from his prostatectomy. Small right lower quadrant well-healed incision from his prior appendectomy.  Eyes: Normal conjunctivae. Normal eyelids.  Ears, Nose, Mouth, and Throat: Left ear no scars, no lesions, no masses. Right ear no scars, no lesions, no masses. Nose no scars, no lesions, no masses. Normal hearing. Normal lips.  Musculoskeletal: Normal gait and station of head and neck.     PAST DATA REVIEWED:  Source Of History:  Patient  Lab Test Review:   CMP  Records Review:   Pathology Reports, Previous Patient Records  X-Ray Review: C.T. Abdomen/Pelvis: Reviewed Films.    Notes:                     His outside CT scan is loaded in Intelerad. His films were reviewed again today.   PROCEDURES: None   ASSESSMENT:  ICD-10 Details  1 GU:   Renal cell carcinoma, left - C64.2    PLAN:           Schedule Return Visit/Planned Activity: Keep Scheduled Appointment          Document Letter(s):  Created for Patient: Clinical Summary         Notes:   1. Renal cell carcinoma of the left kidney: He has made the decision to proceed with definitive treatment with a left laparoscopic radical nephrectomy. We again reviewed the indications for treatment and with biopsy confirmation, he does wish to proceed considering the diagnosis of a higher grade, clear cell tumor. We have discussed the risks of treatment in detail  including but not limited to bleeding, infection, heart attack, stroke, death, venothromoboembolism, cancer recurrence, injury/damage to surrounding organs and structures, urine leak, the possibility of open surgical conversion for patients undergoing minimally invasive surgery, the risk of developing chronic kidney disease and its associated implications, and the potential risk of end stage renal disease possibly necessitating dialysis.   He has received cardiac clearance to proceed. He will be kept on telemetry postoperatively we have agreed to keep him in the hospital until postoperative day 2 considering his advanced age and comorbidities. We will plan to not use Vicryl suture but rather PDS suture on the fascia and Monocryl in the skin considering his history of suture abscesses with Vicryl suture. At his request, he would like to keep an indwelling catheter until right before discharge when he can switch back over to his condom catheter for chronic management of his incontinence status post radical prostatectomy in the past.   All questions were stated to his satisfaction. He is scheduled to proceed with surgery next week.   Cc: Dr. Eber Hong  Dr. Laymond Purser        Next Appointment:      Next Appointment: 10/11/2019 11:30 AM    Appointment Type: Surgery     Location: [facility] [phone]    Provider: Raynelle Bring, M.D.    Reason for Visit: [facility]/IP LEFT LAP RAD NEPHRECTOMY WITH AMANDA      * Signed by Raynelle Bring, M.D. on 10/02/19 at 9:20 PM (EST)*

## 2019-10-11 ENCOUNTER — Other Ambulatory Visit: Payer: Self-pay

## 2019-10-11 ENCOUNTER — Inpatient Hospital Stay (HOSPITAL_COMMUNITY): Payer: Medicare Other | Admitting: Anesthesiology

## 2019-10-11 ENCOUNTER — Encounter (HOSPITAL_COMMUNITY): Payer: Self-pay | Admitting: Urology

## 2019-10-11 ENCOUNTER — Inpatient Hospital Stay (HOSPITAL_COMMUNITY)
Admission: RE | Admit: 2019-10-11 | Discharge: 2019-10-13 | DRG: 658 | Disposition: A | Discharge status: Home / Self Care | Payer: Medicare Other | Attending: Urology | Admitting: Urology

## 2019-10-11 ENCOUNTER — Encounter (HOSPITAL_COMMUNITY)
Admission: RE | Disposition: A | Discharge status: Home / Self Care | Payer: Self-pay | Source: Home / Self Care | Attending: Urology

## 2019-10-11 ENCOUNTER — Inpatient Hospital Stay (HOSPITAL_COMMUNITY): Payer: Medicare Other | Admitting: Physician Assistant

## 2019-10-11 DIAGNOSIS — I10 Essential (primary) hypertension: Secondary | ICD-10-CM | POA: Diagnosis present

## 2019-10-11 DIAGNOSIS — D49512 Neoplasm of unspecified behavior of left kidney: Secondary | ICD-10-CM

## 2019-10-11 DIAGNOSIS — K219 Gastro-esophageal reflux disease without esophagitis: Secondary | ICD-10-CM | POA: Diagnosis present

## 2019-10-11 DIAGNOSIS — N3289 Other specified disorders of bladder: Secondary | ICD-10-CM | POA: Diagnosis present

## 2019-10-11 DIAGNOSIS — Z888 Allergy status to other drugs, medicaments and biological substances status: Secondary | ICD-10-CM | POA: Diagnosis not present

## 2019-10-11 DIAGNOSIS — Z8546 Personal history of malignant neoplasm of prostate: Secondary | ICD-10-CM | POA: Diagnosis not present

## 2019-10-11 DIAGNOSIS — Z9079 Acquired absence of other genital organ(s): Secondary | ICD-10-CM | POA: Diagnosis not present

## 2019-10-11 DIAGNOSIS — N393 Stress incontinence (female) (male): Secondary | ICD-10-CM | POA: Diagnosis present

## 2019-10-11 DIAGNOSIS — Z20822 Contact with and (suspected) exposure to covid-19: Secondary | ICD-10-CM | POA: Diagnosis present

## 2019-10-11 DIAGNOSIS — Z8042 Family history of malignant neoplasm of prostate: Secondary | ICD-10-CM

## 2019-10-11 DIAGNOSIS — G4730 Sleep apnea, unspecified: Secondary | ICD-10-CM | POA: Diagnosis present

## 2019-10-11 DIAGNOSIS — C642 Malignant neoplasm of left kidney, except renal pelvis: Principal | ICD-10-CM | POA: Diagnosis present

## 2019-10-11 DIAGNOSIS — F419 Anxiety disorder, unspecified: Secondary | ICD-10-CM | POA: Diagnosis present

## 2019-10-11 DIAGNOSIS — Z79899 Other long term (current) drug therapy: Secondary | ICD-10-CM

## 2019-10-11 HISTORY — PX: LAPAROSCOPIC NEPHRECTOMY: SHX1930

## 2019-10-11 LAB — BASIC METABOLIC PANEL
Anion gap: 11 (ref 5–15)
BUN: 17 mg/dL (ref 8–23)
CO2: 27 mmol/L (ref 22–32)
Calcium: 9.1 mg/dL (ref 8.9–10.3)
Chloride: 98 mmol/L (ref 98–111)
Creatinine, Ser: 1.63 mg/dL — ABNORMAL HIGH (ref 0.61–1.24)
GFR calc Af Amer: 45 mL/min — ABNORMAL LOW (ref >60)
GFR calc non Af Amer: 39 mL/min — ABNORMAL LOW (ref >60)
Glucose, Bld: 134 mg/dL — ABNORMAL HIGH (ref 70–99)
Potassium: 3.7 mmol/L (ref 3.5–5.1)
Sodium: 136 mmol/L (ref 135–145)

## 2019-10-11 LAB — ABO/RH: ABO/RH(D): O POS

## 2019-10-11 LAB — TYPE AND SCREEN
ABO/RH(D): O POS
Antibody Screen: NEGATIVE

## 2019-10-11 LAB — HEMOGLOBIN AND HEMATOCRIT, BLOOD
HCT: 46.7 % (ref 39.0–52.0)
Hemoglobin: 15.7 g/dL (ref 13.0–17.0)

## 2019-10-11 SURGERY — NEPHRECTOMY, RADICAL, LAPAROSCOPIC, ADULT
Anesthesia: General | Laterality: Left

## 2019-10-11 MED ORDER — PHENYLEPHRINE 40 MCG/ML (10ML) SYRINGE FOR IV PUSH (FOR BLOOD PRESSURE SUPPORT)
PREFILLED_SYRINGE | INTRAVENOUS | Status: DC | PRN
  Administered 2019-10-11: 120 ug via INTRAVENOUS

## 2019-10-11 MED ORDER — FENTANYL CITRATE (PF) 100 MCG/2ML IJ SOLN
INTRAMUSCULAR | Status: AC
  Filled 2019-10-11: qty 4

## 2019-10-11 MED ORDER — HYDRALAZINE HCL 20 MG/ML IJ SOLN
INTRAMUSCULAR | Status: AC
  Filled 2019-10-11: qty 1

## 2019-10-11 MED ORDER — SOTALOL HCL 80 MG PO TABS
40.0000 mg | ORAL_TABLET | Freq: Two times a day (BID) | ORAL | Status: DC
  Administered 2019-10-11: 40 mg via ORAL
  Filled 2019-10-11 (×2): qty 0.5

## 2019-10-11 MED ORDER — IRBESARTAN 150 MG PO TABS
75.0000 mg | ORAL_TABLET | Freq: Two times a day (BID) | ORAL | Status: DC
  Administered 2019-10-11: 75 mg via ORAL
  Filled 2019-10-11: qty 1

## 2019-10-11 MED ORDER — ROCURONIUM BROMIDE 10 MG/ML (PF) SYRINGE
PREFILLED_SYRINGE | INTRAVENOUS | Status: DC | PRN
  Administered 2019-10-11 (×2): 20 mg via INTRAVENOUS
  Administered 2019-10-11: 60 mg via INTRAVENOUS

## 2019-10-11 MED ORDER — CEFAZOLIN SODIUM-DEXTROSE 2-4 GM/100ML-% IV SOLN
2.0000 g | Freq: Once | INTRAVENOUS | Status: AC
  Administered 2019-10-11: 12:00:00 2 g via INTRAVENOUS

## 2019-10-11 MED ORDER — HYDROMORPHONE HCL 1 MG/ML IJ SOLN
INTRAMUSCULAR | Status: AC
  Filled 2019-10-11: qty 2

## 2019-10-11 MED ORDER — ALPRAZOLAM 1 MG PO TABS: 1.0000 mg | ORAL_TABLET | Freq: Two times a day (BID) | ORAL | Status: DC | PRN

## 2019-10-11 MED ORDER — ONDANSETRON HCL 4 MG/2ML IJ SOLN
INTRAMUSCULAR | Status: DC | PRN
  Administered 2019-10-11: 4 mg via INTRAVENOUS

## 2019-10-11 MED ORDER — DEXTROSE-NACL 5-0.45 % IV SOLN: INTRAVENOUS | Status: DC

## 2019-10-11 MED ORDER — HYDRALAZINE HCL 20 MG/ML IJ SOLN
5.0000 mg | Freq: Once | INTRAMUSCULAR | Status: AC
  Administered 2019-10-11: 5 mg via INTRAVENOUS

## 2019-10-11 MED ORDER — BUPIVACAINE LIPOSOME 1.3 % IJ SUSP
20.0000 mL | Freq: Once | INTRAMUSCULAR | Status: AC
  Administered 2019-10-11: 20 mL
  Filled 2019-10-11: qty 20

## 2019-10-11 MED ORDER — BUPIVACAINE HCL 0.25 % IJ SOLN
INTRAMUSCULAR | Status: AC
  Filled 2019-10-11: qty 1

## 2019-10-11 MED ORDER — CEFAZOLIN SODIUM-DEXTROSE 1-4 GM/50ML-% IV SOLN
1.0000 g | Freq: Three times a day (TID) | INTRAVENOUS | Status: AC
  Administered 2019-10-11 – 2019-10-12 (×2): 1 g via INTRAVENOUS
  Filled 2019-10-11 (×2): qty 50

## 2019-10-11 MED ORDER — SODIUM CHLORIDE (PF) 0.9 % IJ SOLN
INTRAMUSCULAR | Status: DC | PRN
  Administered 2019-10-11: 20 mL

## 2019-10-11 MED ORDER — ONDANSETRON HCL 4 MG/2ML IJ SOLN: 4.0000 mg | INTRAMUSCULAR | Status: DC | PRN

## 2019-10-11 MED ORDER — ACETAMINOPHEN 10 MG/ML IV SOLN
1000.0000 mg | Freq: Four times a day (QID) | INTRAVENOUS | Status: DC
  Administered 2019-10-11 – 2019-10-12 (×3): 1000 mg via INTRAVENOUS
  Filled 2019-10-11 (×4): qty 100

## 2019-10-11 MED ORDER — DIPHENHYDRAMINE HCL 12.5 MG/5ML PO ELIX: 12.5000 mg | ORAL_SOLUTION | Freq: Four times a day (QID) | ORAL | Status: DC | PRN

## 2019-10-11 MED ORDER — PROPOFOL 10 MG/ML IV BOLUS
INTRAVENOUS | Status: DC | PRN
  Administered 2019-10-11: 150 mg via INTRAVENOUS

## 2019-10-11 MED ORDER — SUGAMMADEX SODIUM 200 MG/2ML IV SOLN
INTRAVENOUS | Status: DC | PRN
  Administered 2019-10-11: 175.6 mg via INTRAVENOUS

## 2019-10-11 MED ORDER — HYDROMORPHONE HCL 1 MG/ML IJ SOLN
0.2500 mg | INTRAMUSCULAR | Status: DC | PRN
  Administered 2019-10-11 (×4): 0.5 mg via INTRAVENOUS

## 2019-10-11 MED ORDER — PANTOPRAZOLE SODIUM 40 MG PO TBEC
40.0000 mg | DELAYED_RELEASE_TABLET | Freq: Every day | ORAL | Status: DC
  Administered 2019-10-12: 40 mg via ORAL
  Filled 2019-10-11 (×2): qty 1

## 2019-10-11 MED ORDER — LIDOCAINE 2% (20 MG/ML) 5 ML SYRINGE
INTRAMUSCULAR | Status: DC | PRN
  Administered 2019-10-11: 80 mg via INTRAVENOUS

## 2019-10-11 MED ORDER — PHENYLEPHRINE HCL (PRESSORS) 10 MG/ML IV SOLN
INTRAVENOUS | Status: AC
  Filled 2019-10-11: qty 1

## 2019-10-11 MED ORDER — SODIUM CHLORIDE (PF) 0.9 % IJ SOLN
INTRAMUSCULAR | Status: AC
  Filled 2019-10-11: qty 20

## 2019-10-11 MED ORDER — PHENYLEPHRINE HCL-NACL 10-0.9 MG/250ML-% IV SOLN
INTRAVENOUS | Status: DC | PRN
  Administered 2019-10-11: 15 ug/min via INTRAVENOUS

## 2019-10-11 MED ORDER — DEXAMETHASONE SODIUM PHOSPHATE 10 MG/ML IJ SOLN
INTRAMUSCULAR | Status: DC | PRN
  Administered 2019-10-11: 10 mg via INTRAVENOUS

## 2019-10-11 MED ORDER — ONDANSETRON HCL 4 MG/2ML IJ SOLN: 4.0000 mg | Freq: Once | INTRAMUSCULAR | Status: DC | PRN

## 2019-10-11 MED ORDER — HYDRALAZINE HCL 20 MG/ML IJ SOLN: 5.0000 mg | Freq: Three times a day (TID) | INTRAMUSCULAR | Status: DC | PRN

## 2019-10-11 MED ORDER — FENTANYL CITRATE (PF) 100 MCG/2ML IJ SOLN
25.0000 ug | INTRAMUSCULAR | Status: DC | PRN
  Administered 2019-10-11 (×3): 50 ug via INTRAVENOUS

## 2019-10-11 MED ORDER — FENTANYL CITRATE (PF) 100 MCG/2ML IJ SOLN
INTRAMUSCULAR | Status: DC | PRN
  Administered 2019-10-11: 100 ug via INTRAVENOUS
  Administered 2019-10-11 (×2): 50 ug via INTRAVENOUS

## 2019-10-11 MED ORDER — DIPHENHYDRAMINE HCL 50 MG/ML IJ SOLN: 12.5000 mg | Freq: Four times a day (QID) | INTRAMUSCULAR | Status: DC | PRN

## 2019-10-11 MED ORDER — HYDROCODONE-ACETAMINOPHEN 7.5-325 MG PO TABS: 1.0000 | ORAL_TABLET | Freq: Four times a day (QID) | ORAL | 0 refills | Status: AC | PRN

## 2019-10-11 MED ORDER — LACTATED RINGERS IV SOLN: INTRAVENOUS | Status: DC

## 2019-10-11 MED ORDER — TIZANIDINE HCL 4 MG PO TABS
4.0000 mg | ORAL_TABLET | Freq: Four times a day (QID) | ORAL | Status: DC | PRN
  Administered 2019-10-11 – 2019-10-12 (×3): 4 mg via ORAL
  Filled 2019-10-11 (×3): qty 1

## 2019-10-11 MED ORDER — LIDOCAINE 20MG/ML (2%) 15 ML SYRINGE OPTIME
INTRAMUSCULAR | Status: DC | PRN
  Administered 2019-10-11: 1.5 mg/kg/h via INTRAVENOUS

## 2019-10-11 MED ORDER — DOCUSATE SODIUM 100 MG PO CAPS
100.0000 mg | ORAL_CAPSULE | Freq: Two times a day (BID) | ORAL | Status: DC
  Administered 2019-10-11 – 2019-10-12 (×3): 100 mg via ORAL
  Filled 2019-10-11 (×4): qty 1

## 2019-10-11 MED ORDER — FENTANYL CITRATE (PF) 100 MCG/2ML IJ SOLN
INTRAMUSCULAR | Status: AC
  Filled 2019-10-11: qty 2

## 2019-10-11 MED ORDER — HYDROMORPHONE HCL 1 MG/ML IJ SOLN
0.5000 mg | INTRAMUSCULAR | Status: DC | PRN
  Administered 2019-10-11: 0.5 mg via INTRAVENOUS
  Filled 2019-10-11: qty 1

## 2019-10-11 MED ORDER — CEFAZOLIN SODIUM-DEXTROSE 2-4 GM/100ML-% IV SOLN
INTRAVENOUS | Status: AC
  Filled 2019-10-11: qty 100

## 2019-10-11 MED ORDER — HYDROCHLOROTHIAZIDE 12.5 MG PO CAPS
12.5000 mg | ORAL_CAPSULE | Freq: Every day | ORAL | Status: DC
  Administered 2019-10-12: 12.5 mg via ORAL
  Filled 2019-10-11 (×2): qty 1

## 2019-10-11 MED ORDER — HYDRALAZINE HCL 20 MG/ML IJ SOLN
INTRAMUSCULAR | Status: DC | PRN
  Administered 2019-10-11: 5 mg via INTRAVENOUS

## 2019-10-11 SURGICAL SUPPLY — 54 items
ADH SKN CLS APL DERMABOND .7 (GAUZE/BANDAGES/DRESSINGS) ×1
AGENT HMST KT MTR STRL THRMB (HEMOSTASIS)
APL PRP STRL LF DISP 70% ISPRP (MISCELLANEOUS) ×1
BAG LAPAROSCOPIC 12 15 PORT 16 (BASKET) ×1 IMPLANT
BAG RETRIEVAL 12/15 (BASKET) ×2
BAG RETRIEVAL 12/15MM (BASKET) ×1
BAG SPEC THK2 15X12 ZIP CLS (MISCELLANEOUS) ×1
BAG ZIPLOCK 12X15 (MISCELLANEOUS) ×3 IMPLANT
BLADE EXTENDED COATED 6.5IN (ELECTRODE) IMPLANT
BLADE SURG SZ10 CARB STEEL (BLADE) IMPLANT
CHLORAPREP W/TINT 26 (MISCELLANEOUS) ×3 IMPLANT
CLIP VESOLOCK LG 6/CT PURPLE (CLIP) ×5 IMPLANT
CLIP VESOLOCK MED LG 6/CT (CLIP) ×3 IMPLANT
CLIP VESOLOCK XL 6/CT (CLIP) ×2 IMPLANT
COVER SURGICAL LIGHT HANDLE (MISCELLANEOUS) ×3 IMPLANT
COVER WAND RF STERILE (DRAPES) IMPLANT
CUTTER FLEX LINEAR 45M (STAPLE) ×3 IMPLANT
DERMABOND ADVANCED (GAUZE/BANDAGES/DRESSINGS) ×2
DERMABOND ADVANCED .7 DNX12 (GAUZE/BANDAGES/DRESSINGS) ×1 IMPLANT
DRAPE INCISE IOBAN 66X45 STRL (DRAPES) ×3 IMPLANT
DRAPE LAPAROSCOPIC ABDOMINAL (DRAPES) ×3 IMPLANT
DRAPE WARM FLUID 44X44 (DRAPES) IMPLANT
ELECT REM PT RETURN 15FT ADLT (MISCELLANEOUS) ×3 IMPLANT
GLOVE BIO SURGEON STRL SZ 6.5 (GLOVE) ×1 IMPLANT
GLOVE BIO SURGEONS STRL SZ 6.5 (GLOVE)
GLOVE BIOGEL M STRL SZ7.5 (GLOVE) ×3 IMPLANT
GOWN STRL REUS W/TWL LRG LVL3 (GOWN DISPOSABLE) ×6 IMPLANT
HEMOSTAT SURGICEL 4X8 (HEMOSTASIS) IMPLANT
IRRIG SUCT STRYKERFLOW 2 WTIP (MISCELLANEOUS) ×3
IRRIGATION SUCT STRKRFLW 2 WTP (MISCELLANEOUS) ×1 IMPLANT
KIT BASIN OR (CUSTOM PROCEDURE TRAY) ×3 IMPLANT
KIT TURNOVER KIT A (KITS) ×2 IMPLANT
RELOAD 45 VASCULAR/THIN (ENDOMECHANICALS) ×3 IMPLANT
RELOAD STAPLE 45 2.5 WHT GRN (ENDOMECHANICALS) IMPLANT
SCISSORS LAP 5X35 DISP (ENDOMECHANICALS) ×2 IMPLANT
SET TUBE SMOKE EVAC HIGH FLOW (TUBING) ×2 IMPLANT
SHEARS HARMONIC ACE PLUS 36CM (ENDOMECHANICALS) ×3 IMPLANT
SURGIFLO W/THROMBIN 8M KIT (HEMOSTASIS) IMPLANT
SUT MNCRL AB 4-0 PS2 18 (SUTURE) ×6 IMPLANT
SUT PDS AB 0 CT 36 (SUTURE) ×2 IMPLANT
SUT PDS AB 0 CT1 36 (SUTURE) ×4 IMPLANT
SUT PDS AB 1 CT1 27 (SUTURE) ×2 IMPLANT
SUT PDS AB 1 CTX 36 (SUTURE) ×6 IMPLANT
SUT VIC AB 2-0 SH 27 (SUTURE)
SUT VIC AB 2-0 SH 27X BRD (SUTURE) IMPLANT
SUT VICRYL 0 UR6 27IN ABS (SUTURE) ×1 IMPLANT
TOWEL OR 17X26 10 PK STRL BLUE (TOWEL DISPOSABLE) ×3 IMPLANT
TOWEL OR NON WOVEN STRL DISP B (DISPOSABLE) ×3 IMPLANT
TRAY FOLEY MTR SLVR 16FR STAT (SET/KITS/TRAYS/PACK) ×3 IMPLANT
TRAY LAPAROSCOPIC (CUSTOM PROCEDURE TRAY) ×3 IMPLANT
TROCAR BLADELESS OPT 5 100 (ENDOMECHANICALS) ×1 IMPLANT
TROCAR XCEL 12X100 BLDLESS (ENDOMECHANICALS) ×3 IMPLANT
TROCAR XCEL BLUNT TIP 100MML (ENDOMECHANICALS) ×3 IMPLANT
YANKAUER SUCT BULB TIP 10FT TU (MISCELLANEOUS) IMPLANT

## 2019-10-11 NOTE — Interval H&P Note (Signed)
History and Physical Interval Note:  10/11/2019 10:23 AM  Darrell Cook  has presented today for surgery, with the diagnosis of LEFT RENAL CELL CARCINOMA.  The various methods of treatment have been discussed with the patient and family. After consideration of risks, benefits and other options for treatment, the patient has consented to  Procedure(s) with comments: LAPAROSCOPIC NEPHRECTOMY (Left) - ONLY NEEDS 210 MIN FOR PROCEDURE as a surgical intervention.  The patient's history has been reviewed, patient examined, no change in status, stable for surgery.  I have reviewed the patient's chart and labs.  Questions were answered to the patient's satisfaction.     [employer]

## 2019-10-11 NOTE — Progress Notes (Signed)
Patient ID: GWYN LEHNE, male   DOB: 11-19-1940, 79 y.o.   MRN: [id number]  Post-op note  Subjective: The patient is doing well.  No complaints.  Objective: Vital signs in last 24 hours: Temp:  [97.7 F (36.5 C)-97.9 F (36.6 C)] 97.7 F (36.5 C) (02/04 1627) Pulse Rate:  [59-72] 72 (02/04 1627) Resp:  [12-24] 18 (02/04 1627) BP: (157-187)/(87-107) 186/101 (02/04 1627) SpO2:  [100 %] 100 % (02/04 1627) Weight:  [86.4 kg-87.8 kg] 86.4 kg (02/04 1627)  Intake/Output from previous day: No intake/output data recorded. Intake/Output this shift: Total I/O In: 2500 [I.V.:2400; IV Piggyback:100] Out: 675 [Urine:650; Blood:25]  Physical Exam:  General: Alert and oriented. Abdomen: Soft, Nondistended. Incisions: Clean and dry.  Lab Results: Recent Labs    10/11/19 1537  HGB 15.7  HCT 46.7    Assessment/Plan: POD#0   1) Continue to monitor, ambulate, IS, prn BP control meds   Pryor Curia. MD   LOS: 0 days   Dutch Gray 10/11/2019, 4:47 PM

## 2019-10-11 NOTE — Transfer of Care (Signed)
Immediate Anesthesia Transfer of Care Note  Patient: Darrell Cook  Procedure(s) Performed: LAPAROSCOPIC NEPHRECTOMY (Left )  Patient Location: PACU  Anesthesia Type:General  Level of Consciousness: drowsy  Airway & Oxygen Therapy: Patient Spontanous Breathing and Patient connected to face mask oxygen  Post-op Assessment: Report given to RN and Post -op Vital signs reviewed and stable  Post vital signs: Reviewed  Last Vitals:  Vitals Value Taken Time  BP 180/106 10/11/19 1500  Temp 36.6 C 10/11/19 1447  Pulse 62 10/11/19 1501  Resp 16 10/11/19 1501  SpO2 100 % 10/11/19 1501  Vitals shown include unvalidated device data.  Last Pain:  Vitals:   10/11/19 0951  TempSrc:   PainSc: 0-No pain         Complications: No apparent anesthesia complications

## 2019-10-11 NOTE — Progress Notes (Signed)
Pt has declined use of CPAP tonight.  Pt has home CPAP at bedside.  RT to monitor and assess as needed.

## 2019-10-11 NOTE — Anesthesia Procedure Notes (Signed)
Procedure Name: Intubation Date/Time: 10/11/2019 11:47 AM Performed by: Lavina Hamman, CRNA Pre-anesthesia Checklist: Patient identified, Emergency Drugs available, Suction available, Patient being monitored and Timeout performed Patient Re-evaluated:Patient Re-evaluated prior to induction Oxygen Delivery Method: Circle system utilized Preoxygenation: Pre-oxygenation with 100% oxygen Induction Type: IV induction Ventilation: Mask ventilation without difficulty Laryngoscope Size: Mac and 4 Grade View: Grade II Tube type: Oral Tube size: 7.5 mm Number of attempts: 1 Airway Equipment and Method: Stylet Placement Confirmation: ETT inserted through vocal cords under direct vision,  positive ETCO2,  CO2 detector and breath sounds checked- equal and bilateral Secured at: 24 cm Tube secured with: Tape

## 2019-10-11 NOTE — Anesthesia Preprocedure Evaluation (Addendum)
Anesthesia Evaluation  Patient identified by MRN, date of birth, ID band Patient awake    Reviewed: Allergy & Precautions, NPO status , Patient's Chart, lab work & pertinent test results, reviewed documented beta blocker date and time   History of Anesthesia Complications (+) PROLONGED EMERGENCE and history of anesthetic complications  Airway Mallampati: II  TM Distance: <3 FB Neck ROM: Full    Dental  (+) Teeth Intact, Dental Advisory Given   Pulmonary sleep apnea and Continuous Positive Airway Pressure Ventilation ,    Pulmonary exam normal breath sounds clear to auscultation       Cardiovascular hypertension, Pt. on medications and Pt. on home beta blockers Normal cardiovascular exam+ dysrhythmias Atrial Fibrillation  Rhythm:Regular Rate:Normal     Neuro/Psych PSYCHIATRIC DISORDERS Anxiety negative neurological ROS     GI/Hepatic Neg liver ROS, GERD  Medicated and Controlled,  Endo/Other  negative endocrine ROS  Renal/GU Renal InsufficiencyRenal diseaseLEFT RENAL CELL CARCINOMA     Musculoskeletal  (+) Arthritis ,   Abdominal   Peds  Hematology negative hematology ROS (+)   Anesthesia Other Findings Day of surgery medications reviewed with the patient.  Reproductive/Obstetrics                            Anesthesia Physical Anesthesia Plan  ASA: III  Anesthesia Plan: General   Post-op Pain Management:    Induction: Intravenous  PONV Risk Score and Plan: 3 and Ondansetron, Dexamethasone and Propofol infusion  Airway Management Planned: Oral ETT and Video Laryngoscope Planned  Additional Equipment:   Intra-op Plan:   Post-operative Plan: Extubation in OR  Informed Consent: I have reviewed the patients History and Physical, chart, labs and discussed the procedure including the risks, benefits and alternatives for the proposed anesthesia with the patient or authorized  representative who has indicated his/her understanding and acceptance.     Dental advisory given  Plan Discussed with: CRNA  Anesthesia Plan Comments:        Anesthesia Quick Evaluation

## 2019-10-11 NOTE — Op Note (Signed)
Preoperative diagnosis: Left renal neoplasm  Postoperative diagnosis: Left renal neoplasm  Procedure: 1.  Left laparoscopic radical nephrectomy  Surgeon: Pryor Curia. M.D.  Assistant(s): Debbrah Alar, PA-C  An assistant was required for this surgical procedure.  The duties of the assistant included but were not limited to suctioning, passing suture, camera manipulation, retraction. This procedure would not be able to be performed without an Environmental consultant.  Resident: Dr. Tharon Aquas  Anesthesia: General  Complications: None  EBL: 50 mL  IVF:  1500 mL crystalloid  Specimens: 1. Left kidney  Disposition of specimens: Pathology  Indication: Darrell Cook is a 79 y.o. patient with a left renal tumor suspicious for malignancy.  After a thorough review of the management options for their renal mass, they elected to proceed with surgical treatment and the above procedure.  We have discussed the potential benefits and risks of the procedure, side effects of the proposed treatment, the likelihood of the patient achieving the goals of the procedure, and any potential problems that might occur during the procedure or recuperation. Informed consent has been obtained.  Description of procedure:  The patient was taken to the operating room and a general anesthetic was administered. The patient was given preoperative antibiotics, placed in the left modified flank position, and prepped and draped in the usual sterile fashion. Next a preoperative timeout was performed.  A site was selected near the umbilicus for placement of the camera port. This was placed using a standard open Hassan technique which allowed entry into the peritoneal cavity under direct vision and without difficulty. A 12 mm Hassan cannula was placed and a pneumoperitoneum established. The camera was then used to inspect the abdomen and there was no evidence of any intra-abdominal injuries or other abnormalities. The  remaining abdominal ports were then placed. A 12 mm port was placed in the left lower quadrant and a 5 mm port was placed in the left upper quadrant.  All ports were placed under direct vision without difficulty.  Utilizing the harmonic scalpel, the white line of Toldt was incised allowing the colon to be reflected medially and the plane between the mesocolon and the anterior layer of Gerota's fascia to be developed and the kidney exposed.  The ureter and gonadal vein were identified inferiorly and the ureter was lifted anteriorly off the psoas muscle.  Dissection proceeded superiorly along the gonadal vein until the renal vein was identified.  The renal hilum was then carefully isolated with a combination of blunt and sharp dissectiong allowing the renal arterial and venous structures to be separated and isolated. There was a single branching renal artery and a single renal vein.  The renal artery was isolated and ligated with multiple Weck clips and subsequently divided.  The renal vein was then isolated and also ligated and divided with a 45 mm Flex ETS stapler.  Gerota's fascia was intentionally entered superiorly and the space between the adrenal gland and the kidney was developed allowing the adrenal gland to be spared.  The splenorenal ligaments were divided with the harmonic scalpel.  The lateral and posterior attachements to the kidney were then divided.  The ureter was ligated with Weck clips and divided allowing the specimen to be freed from all surrounding structures.  The kidney specimen was then placed into a 12 mm retrieval bag.  The renal hilum, liver, adrenal bed and gonadal vein areas were each inspected and hemostasis was ensured with the pneomperitoneal pressures lowered.  The 12 mm lower  quadrant port was then closed with a 0-PDS suture placed laparoscopically to close the fascia of this incision.  An additional figure of eight PDS suture was placed due to some bleeding noted at the port  site during the initial closure stitch. All remaining ports were removed under direct vision.  The kidney specimen was removed intact within the retrieval bag via the camera port site after this incision was extended slightly. This fascial opening was then closed with two #1 PDS sutures.    All incisions were injected with local anesthetic and reapproximated at the skin with 4-0 monocryl sutures.  Dermabond was applied to the skin. The patient tolerated the procedure well and without complications and was transferred to the recovery unit in satisfactory condition.   Pryor Curia MD

## 2019-10-12 ENCOUNTER — Encounter: Payer: Self-pay | Admitting: *Deleted

## 2019-10-12 LAB — BASIC METABOLIC PANEL
Anion gap: 8 (ref 5–15)
BUN: 20 mg/dL (ref 8–23)
CO2: 27 mmol/L (ref 22–32)
Calcium: 8.6 mg/dL — ABNORMAL LOW (ref 8.9–10.3)
Chloride: 95 mmol/L — ABNORMAL LOW (ref 98–111)
Creatinine, Ser: 2.04 mg/dL — ABNORMAL HIGH (ref 0.61–1.24)
GFR calc Af Amer: 35 mL/min — ABNORMAL LOW (ref >60)
GFR calc non Af Amer: 30 mL/min — ABNORMAL LOW (ref >60)
Glucose, Bld: 168 mg/dL — ABNORMAL HIGH (ref 70–99)
Potassium: 3.6 mmol/L (ref 3.5–5.1)
Sodium: 130 mmol/L — ABNORMAL LOW (ref 135–145)

## 2019-10-12 LAB — HEMOGLOBIN AND HEMATOCRIT, BLOOD
HCT: 42.5 % (ref 39.0–52.0)
Hemoglobin: 14.6 g/dL (ref 13.0–17.0)

## 2019-10-12 MED ORDER — ACETAMINOPHEN 500 MG PO TABS
500.0000 mg | ORAL_TABLET | Freq: Four times a day (QID) | ORAL | Status: DC | PRN
  Administered 2019-10-12: 500 mg via ORAL
  Filled 2019-10-12 (×2): qty 1

## 2019-10-12 MED ORDER — DEXTROSE-NACL 5-0.9 % IV SOLN: INTRAVENOUS | Status: DC

## 2019-10-12 MED ORDER — SOTALOL HCL 80 MG PO TABS
40.0000 mg | ORAL_TABLET | Freq: Every day | ORAL | Status: DC
  Administered 2019-10-12: 40 mg via ORAL
  Filled 2019-10-12: qty 0.5

## 2019-10-12 MED ORDER — HYDROCODONE-ACETAMINOPHEN 5-325 MG PO TABS
1.0000 | ORAL_TABLET | ORAL | Status: DC | PRN
  Administered 2019-10-12 – 2019-10-13 (×3): 1 via ORAL
  Filled 2019-10-12 (×3): qty 1

## 2019-10-12 MED ORDER — BISACODYL 10 MG RE SUPP
10.0000 mg | Freq: Once | RECTAL | Status: AC
  Administered 2019-10-12: 10 mg via RECTAL
  Filled 2019-10-12: qty 1

## 2019-10-12 NOTE — Progress Notes (Signed)
Patient ID: Darrell Cook, male   DOB: [birth year], 79 y.o.   MRN: [id number]  1 Day Post-Op Subjective: Pt doing well overnight.  No flatus.  Some bloating.  Minimal nausea.  Ambulated last night well with assistance. Pt informs me this morning he has an artificial sphincter.  He states he had deactivated this preoperatively and that it has not worked well in a long time.  He had not made this known preoperatively at his evaluation.  Objective: Vital signs in last 24 hours: Temp:  [97.7 F (36.5 C)-98.3 F (36.8 C)] 97.7 F (36.5 C) (02/05 0610) Pulse Rate:  [53-72] 58 (02/05 0610) Resp:  [12-24] 18 (02/05 0610) BP: (142-187)/(79-107) 162/83 (02/05 0610) SpO2:  [96 %-100 %] 98 % (02/05 0610) Weight:  [86.4 kg-87.8 kg] 86.4 kg (02/04 1627)  Intake/Output from previous day: 02/04 0701 - 02/05 0700 In: 5129.3 [P.O.:600; I.V.:4080.2; IV Piggyback:449.1] Out: 1975 [id number]; Blood:25] Intake/Output this shift: No intake/output data recorded.  Physical Exam:  General: Alert and oriented CV: RRR Lungs: Clear Abdomen: Soft, ND, positive BS Incisions: C/D/I GU: Cathter in place with clear urine, sphincter pump noted in scrotum in deactivated position Ext: NT, No erythema  Lab Results: Recent Labs    10/11/19 1537 10/12/19 0431  HGB 15.7 14.6  HCT 46.7 42.5   BMET Recent Labs    10/11/19 1537 10/12/19 0431  NA 136 130*  K 3.7 3.6  CL 98 95*  CO2 27 27  GLUCOSE 134* 168*  BUN 17 20  CREATININE 1.63* 2.04*  CALCIUM 9.1 8.6*     Studies/Results: No results found.  Assessment/Plan: POD # 1 s/p left lap radical nephrectomy - Path pending - D/C Foley and resume use of condom catheter - Ambulate, IS - Advance diet - Oral pain medication - Dulcolax suppository - Likely plan for discharge tomorrow   LOS: 1 day   Dutch Gray 10/12/2019, 7:44 AM

## 2019-10-12 NOTE — Progress Notes (Signed)
Patient ID: Darrell Cook, male   DOB: Oct 01, 1940, 79 y.o.   MRN: [id number]  Doing well today.  Tolerating diet.  Had BM.  Pain controlled.  SL IV tonight.  Check renal function in AM.  Discharge tomorrow if doing well.

## 2019-10-12 NOTE — Plan of Care (Signed)
  Problem: Education: Goal: Knowledge of the prescribed therapeutic regimen will improve Outcome: Completed/Met   Problem: Bowel/Gastric: Goal: Gastrointestinal status for postoperative course will improve Outcome: Progressing   Problem: Clinical Measurements: Goal: Postoperative complications will be avoided or minimized Outcome: Progressing   Problem: Respiratory: Goal: Ability to achieve and maintain a regular respiratory rate will improve Outcome: Completed/Met   Problem: Skin Integrity: Goal: Demonstration of wound healing without infection will improve Outcome: Progressing   Problem: Urinary Elimination: Goal: Ability to avoid or minimize complications of infection will improve Outcome: Progressing Goal: Ability to achieve and maintain urine output will improve Outcome: Progressing   Problem: Education: Goal: Knowledge of General Education information will improve Description: Including pain rating scale, medication(s)/side effects and non-pharmacologic comfort measures Outcome: Completed/Met   Problem: Health Behavior/Discharge Planning: Goal: Ability to manage health-related needs will improve Outcome: Progressing   Problem: Clinical Measurements: Goal: Ability to maintain clinical measurements within normal limits will improve Outcome: Progressing Goal: Will remain free from infection Outcome: Progressing Goal: Diagnostic test results will improve Outcome: Progressing Goal: Respiratory complications will improve Outcome: Completed/Met Goal: Cardiovascular complication will be avoided Outcome: Progressing   Problem: Activity: Goal: Risk for activity intolerance will decrease Outcome: Progressing   Problem: Nutrition: Goal: Adequate nutrition will be maintained Outcome: Progressing   Problem: Coping: Goal: Level of anxiety will decrease Outcome: Progressing   Problem: Elimination: Goal: Will not experience complications related to bowel  motility Outcome: Progressing Goal: Will not experience complications related to urinary retention Outcome: Progressing   Problem: Pain Managment: Goal: General experience of comfort will improve Outcome: Progressing   Problem: Safety: Goal: Ability to remain free from injury will improve Outcome: Progressing   Problem: Skin Integrity: Goal: Risk for impaired skin integrity will decrease Outcome: Progressing

## 2019-10-12 NOTE — Progress Notes (Signed)
Urology Progress Note   1 Day Post-Op s/p laparoscopic left nephrectomy  Subjective: Did well overnight.  Use pain medicine the pain controlled.  Ambulated.  Tolerating clears.  Objective: Vital signs in last 24 hours: Temp:  [97.7 F (36.5 C)-98.3 F (36.8 C)] 97.7 F (36.5 C) (02/05 0610) Pulse Rate:  [53-72] 58 (02/05 0610) Resp:  [12-24] 18 (02/05 0610) BP: (142-187)/(79-107) 162/83 (02/05 0610) SpO2:  [96 %-100 %] 98 % (02/05 0610) Weight:  [86.4 kg-87.8 kg] 86.4 kg (02/04 1627)  Intake/Output from previous day: 02/04 0701 - 02/05 0700 In: 5129.3 [P.O.:600; I.V.:4080.2; IV Piggyback:449.1] Out: 1975 [id number]; Blood:25] Intake/Output this shift: No intake/output data recorded.  Physical Exam:  General: Alert and oriented CV: RRR Lungs: Normal work of breathing Abdomen: Soft, appropriately tender.  Incisions clean dry and intact.  Bruising around extraction site GU: Foley in place draining clear yellow urine  Ext: NT, No erythema  Lab Results: Recent Labs    10/11/19 1537 10/12/19 0431  HGB 15.7 14.6  HCT 46.7 42.5   BMET Recent Labs    10/11/19 1537 10/12/19 0431  NA 136 130*  K 3.7 3.6  CL 98 95*  CO2 27 27  GLUCOSE 134* 168*  BUN 17 20  CREATININE 1.63* 2.04*  CALCIUM 9.1 8.6*     Studies/Results: No results found.  Assessment/Plan:  79 y.o. male s/p laparoscopic left radical nephrectomy.  Expected postop course thus far.  We will normalize him today but will plan to keep in the hospital due to patient age..    -Regular diet -Stop fluids -Oral pain medication -Ambulate -Keep Foley catheter in place until 2/6.  Patient is incontinent at baseline and we can switch to a condom catheter when the Foley is removed   Dispo: Floor    LOS: 1 day   Tharon Aquas 10/12/2019, 7:19 AM

## 2019-10-12 NOTE — Discharge Summary (Signed)
Date of admission: 10/11/2019  Date of discharge: 10/13/2019  Admission diagnosis: Left renal cell caricinoma  Discharge diagnosis:  Left renal cell carcinoma  Secondary diagnoses:  Hypertension, atrial flutter  History and Physical: For full details, please see admission history and physical. Briefly, Darrell Cook is a 79 y.o. 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  Hospital Course:  He was taken to the operating room on 10/11/2019 and underwent a left laparoscopic radical nephrectomy.  His surgery was uneventful.  Postoperatively, he remained hemodynamically stable.  He was monitored on telemetry due to his history of atrial flutter.  He did not demonstrate any ectopy.  He was hemodynamically stable on postoperative day 1 in begin ambulating without difficulty.  His diet was gradually advanced and he was able to be placed on oral pain medication. His serum creatinine on postoperative day 1 was 2.48 up from a baseline of 1.6.   He was discharged home on POD #2.  Laboratory values:  Recent Labs    10/11/19 1537 10/12/19 0431  HGB 15.7 14.6  HCT 46.7 42.5   Recent Labs    10/12/19 0431 10/13/19 0505  CREATININE 2.04* 2.48*    Disposition: Home  Discharge instruction: The patient was instructed to be ambulatory but told to refrain from heavy lifting, strenuous activity, or driving.  Discharge medications:  Allergies as of 10/13/2019      Reactions   Other    Other reaction(s): Other (See Comments) Vicryl- causes infection Dexon-Vicryl Suture abcess   Benzodiazepines    Magnesium    IV      Medication List    STOP taking these medications   aspirin 81 MG EC tablet     TAKE these medications   ALPRAZolam 1 MG tablet Commonly known as: XANAX Take 1 mg by mouth 2 (two) times daily as needed for anxiety.   hydrochlorothiazide 12.5 MG capsule Commonly known as: MICROZIDE Take 12.5 mg by mouth  daily.   HYDROcodone-acetaminophen 7.5-325 MG tablet Commonly known as: NORCO Take 1 tablet by mouth every 6 (six) hours as needed for moderate pain. What changed: Another medication with the same name was added. Make sure you understand how and when to take each.   HYDROcodone-acetaminophen 5-325 MG tablet Commonly known as: NORCO/VICODIN Take 1 tablet by mouth every 4 (four) hours as needed for moderate pain or severe pain. What changed: You were already taking a medication with the same name, and this prescription was added. Make sure you understand how and when to take each.   imiquimod 5 % cream Commonly known as: ALDARA Apply 1 application topically 2 (two) times a week.   irbesartan 150 MG tablet Commonly known as: AVAPRO Take 75 mg by mouth 2 (two) times daily.   omeprazole 20 MG capsule Commonly known as: PRILOSEC Take 20 mg by mouth 2 (two) times daily before a meal.   opium-belladonna 16.2-60 MG suppository Commonly known as: B&O SUPPRETTES Place 1 suppository rectally every 8 (eight) hours as needed for bladder spasms.   sotalol 80 MG tablet Commonly known as: BETAPACE Take 40 mg by mouth 2 (two) times daily.   tiZANidine 4 MG tablet Commonly known as: ZANAFLEX Take 4 mg by mouth every 6 (six) hours as needed for muscle spasms.   zolpidem 10 MG tablet Commonly known as: AMBIEN Take 10 mg by mouth at bedtime as needed for sleep.  Followup:  Follow-up Information    Raynelle Bring, MD On 10/31/2019.   Specialty: Urology Why: at 12:30 Contact information: [place]  [phone]

## 2019-10-12 NOTE — Progress Notes (Signed)
Pt. Noted to have home CPAP.  Will monitor if needed.

## 2019-10-13 LAB — BASIC METABOLIC PANEL
Anion gap: 14 (ref 5–15)
BUN: 24 mg/dL — ABNORMAL HIGH (ref 8–23)
CO2: 27 mmol/L (ref 22–32)
Calcium: 8.6 mg/dL — ABNORMAL LOW (ref 8.9–10.3)
Chloride: 95 mmol/L — ABNORMAL LOW (ref 98–111)
Creatinine, Ser: 2.48 mg/dL — ABNORMAL HIGH (ref 0.61–1.24)
GFR calc Af Amer: 27 mL/min — ABNORMAL LOW (ref >60)
GFR calc non Af Amer: 24 mL/min — ABNORMAL LOW (ref >60)
Glucose, Bld: 105 mg/dL — ABNORMAL HIGH (ref 70–99)
Potassium: 3.4 mmol/L — ABNORMAL LOW (ref 3.5–5.1)
Sodium: 136 mmol/L (ref 135–145)

## 2019-10-13 MED ORDER — BELLADONNA ALKALOIDS-OPIUM 16.2-60 MG RE SUPP
1.0000 | Freq: Three times a day (TID) | RECTAL | Status: DC | PRN
  Administered 2019-10-13: 1 via RECTAL
  Filled 2019-10-13: qty 1

## 2019-10-13 MED ORDER — BELLADONNA ALKALOIDS-OPIUM 16.2-60 MG RE SUPP: 1.0000 | Freq: Three times a day (TID) | RECTAL | 0 refills | Status: AC | PRN

## 2019-10-13 MED ORDER — HYDROCODONE-ACETAMINOPHEN 5-325 MG PO TABS: 1.0000 | ORAL_TABLET | ORAL | 0 refills | Status: AC | PRN

## 2019-10-13 NOTE — Progress Notes (Signed)
Pt is complaining of bladder spasms that are worsening. Zanaflex given with no relief. On call notified and order for B&O suppository given. Will carry out order.

## 2019-10-13 NOTE — Progress Notes (Signed)
Patient ID: Darrell Cook, male   DOB: [birth year], 79 y.o.   MRN: [id number]  2 Days Post-Op Subjective: No acute issues overnight.  Was given B&O suppository for bladder spasms which are common for him, but seemed to be worse then normal overnight. 2/10 pain this AM. No nausea, passing gas, walking, tolerating PO intake  Objective: Vital signs in last 24 hours: Temp:  [97.6 F (36.4 C)-98.8 F (37.1 C)] (P) 98.8 F (37.1 C) (02/06 0800) Pulse Rate:  [57-63] (P) 58 (02/06 0800) Resp:  [18-20] (P) 20 (02/06 0800) BP: (136-164)/(78-95) (P) 151/87 (02/06 0800) SpO2:  [98 %-100 %] 98 % (02/06 0414)  Intake/Output from previous day: 02/05 0701 - 02/06 0700 In: 1320.3 [P.O.:720; I.V.:600.3] Out: 1750 [Urine:1750] Intake/Output this shift: No intake/output data recorded.  Physical Exam:  General: Alert and oriented Lungs: non labored breathing Abdomen: Soft, ND Incisions: C/D/I GU: condom catheter in place, clear yellow urine Ext: NT, No erythema  Lab Results: Recent Labs    10/11/19 1537 10/12/19 0431  HGB 15.7 14.6  HCT 46.7 42.5   BMET Recent Labs    10/12/19 0431 10/13/19 0505  NA 130* 136  K 3.6 3.4*  CL 95* 95*  CO2 27 27  GLUCOSE 168* 105*  BUN 20 24*  CREATININE 2.04* 2.48*  CALCIUM 8.6* 8.6*     Studies/Results: No results found.  Assessment/Plan: POD # 2 s/p left lap radical nephrectomy - Ready for discharge - added B&O suppository Rx spasms - recommended he consider a penile clamp for his SUI - will recheck his BMP in clinic at his next f/u, encouraged adequate PO intake.   LOS: 2 days   Ardis Hughs 10/13/2019, 8:53 AM

## 2019-10-13 NOTE — Discharge Instructions (Signed)
1.  Activity:  You are encouraged to ambulate frequently (about every hour during waking hours) to help prevent blood clots from forming in your legs or lungs.  However, you should not engage in any heavy lifting (> 10-15 lbs), strenuous activity, or straining. 2. Diet: You should advance your diet as instructed by your physician.  It will be normal to have some bloating, nausea, and abdominal discomfort intermittently. 3. Prescriptions:  You will be provided a prescription for pain medication to take as needed.  If your pain is not severe enough to require the prescription pain medication, you may take extra strength Tylenol instead which will have less side effects.  You should also take a prescribed stool softener to avoid straining with bowel movements as the prescription pain medication may constipate you. 4. Incisions: You may remove your dressing bandages 48 hours after surgery if not removed in the hospital.  You will either have some small staples or special tissue glue at each of the incision sites. Once the bandages are removed (if present), the incisions may stay open to air.  You may start showering (but not soaking or bathing in water) the 2nd day after surgery and the incisions simply need to be patted dry after the shower.  No additional care is needed. 5. What to call us about: You should call the office [phone]) if you develop fever > 101 or develop persistent vomiting.   You may resume aspirin, advil, aleve, vitamins, and supplements 7 days after surgery.   Penile Cunningham Clamp can be worn for stress incontinence.

## 2019-10-15 LAB — SURGICAL PATHOLOGY

## 2019-11-06 NOTE — Anesthesia Postprocedure Evaluation (Signed)
Anesthesia Post Note  Patient: Darrell Cook  Procedure(s) Performed: LAPAROSCOPIC NEPHRECTOMY (Left )     Patient location during evaluation: PACU Anesthesia Type: General Level of consciousness: awake Pain management: pain level controlled Vital Signs Assessment: post-procedure vital signs reviewed and stable Respiratory status: spontaneous breathing Cardiovascular status: stable Postop Assessment: no apparent nausea or vomiting Anesthetic complications: no    Last Vitals:  Vitals:   10/13/19 0414 10/13/19 0800  BP: 136/78 (!) 151/87  Pulse: (!) 57 (!) 58  Resp: 18 20  Temp: 36.7 C 37.1 C  SpO2: 98% 99%    Last Pain:  Vitals:   10/13/19 0800  TempSrc: Oral  PainSc: 3    Pain Goal: Patients Stated Pain Goal: 2 (10/13/19 0540)                 Huston Foley

## 2020-05-28 ENCOUNTER — Other Ambulatory Visit: Payer: Self-pay

## 2020-05-28 ENCOUNTER — Other Ambulatory Visit (HOSPITAL_COMMUNITY): Payer: Self-pay | Admitting: Urology

## 2020-05-28 ENCOUNTER — Ambulatory Visit (HOSPITAL_COMMUNITY)
Admission: RE | Admit: 2020-05-28 | Discharge: 2020-05-28 | Disposition: A | Discharge status: Home / Self Care | Payer: Medicare Other | Source: Ambulatory Visit | Attending: Urology | Admitting: Urology

## 2020-05-28 DIAGNOSIS — C642 Malignant neoplasm of left kidney, except renal pelvis: Secondary | ICD-10-CM

## 2020-06-09 ENCOUNTER — Other Ambulatory Visit (HOSPITAL_COMMUNITY): Payer: Self-pay | Admitting: Urology

## 2020-06-09 ENCOUNTER — Other Ambulatory Visit: Payer: Self-pay | Admitting: Urology

## 2020-06-09 DIAGNOSIS — C642 Malignant neoplasm of left kidney, except renal pelvis: Secondary | ICD-10-CM

## 2020-06-19 ENCOUNTER — Ambulatory Visit (HOSPITAL_COMMUNITY): Payer: Medicare Other

## 2020-06-19 ENCOUNTER — Other Ambulatory Visit (HOSPITAL_COMMUNITY): Payer: Medicare Other

## 2021-01-04 DEATH — deceased

## 2021-05-06 IMAGING — MR MR ABDOMEN WO/W CM
9 of 18 series · 20 of 48 positions shown · IV contrast (gadavist)
Comparison: Outside CT abdomen 06/21/2019 and outside MRI pelvis
from 08/04/2016

CLINICAL DATA: Left renal malignancy.

EXAM:
MRI ABDOMEN AND PELVIS WITHOUT AND WITH CONTRAST
TECHNIQUE: Multiplanar multisequence MR imaging of the abdomen and pelvis was
performed both before and after the administration of intravenous
contrast.
CONTRAST:  8mL GADAVIST GADOBUTROL 1 MMOL/ML IV SOLN

[Series 4: DWI b500 · axial · 6.0mm · 1.56mm/px · z∈[+12,+285]mm · 3 of 72 slices shown]
[im 1/72]
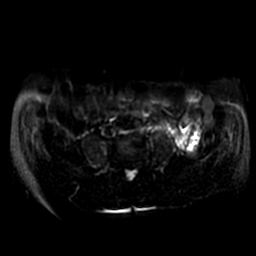
[im 36/72]
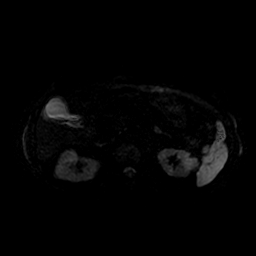
[im 72/72]
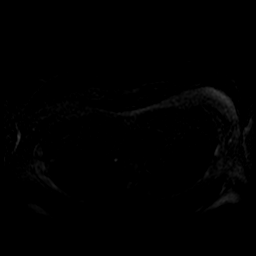

[Series 5: T2 fat-sat · axial · 5.0mm · 0.78mm/px · z∈[+11,+286]mm · 2 of 56 slices shown]
[im 1/56]
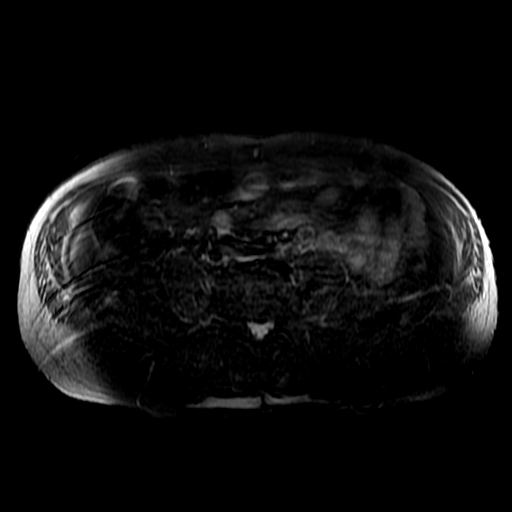
[im 56/56]
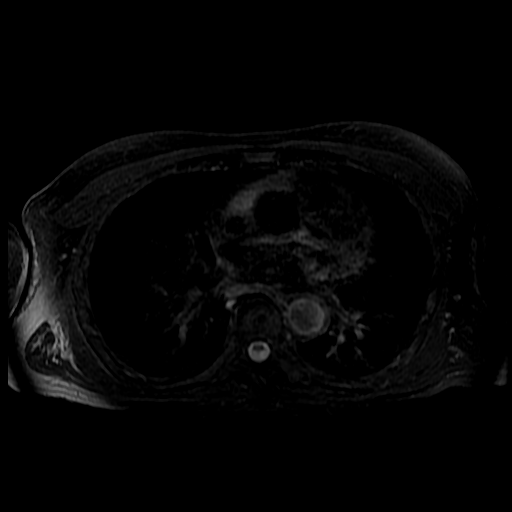

[Series 6: T2 · axial · 5.0mm · 0.78mm/px · z∈[+11,+286]mm · 2 of 56 slices shown (1 of 2)]
[im 1/56]
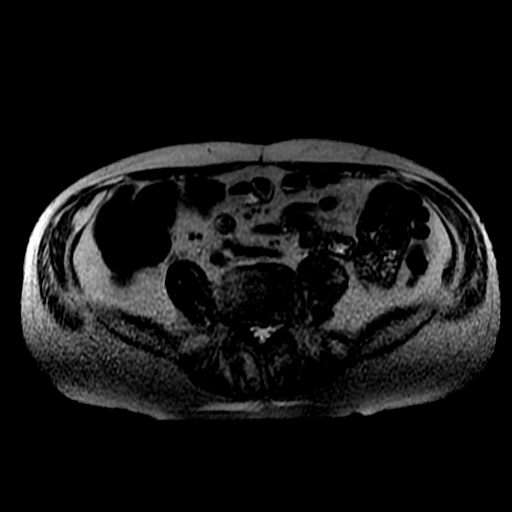
[im 56/56]
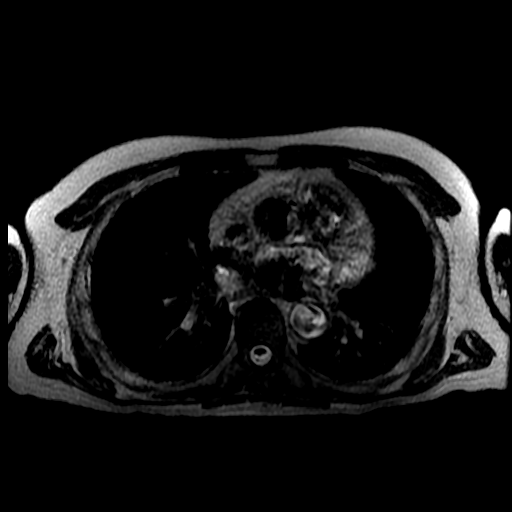

[Series 7: T2 · coronal · 5.0mm · 0.78mm/px · 1 of 44 slices shown (2 of 2)]
[im 1/44]
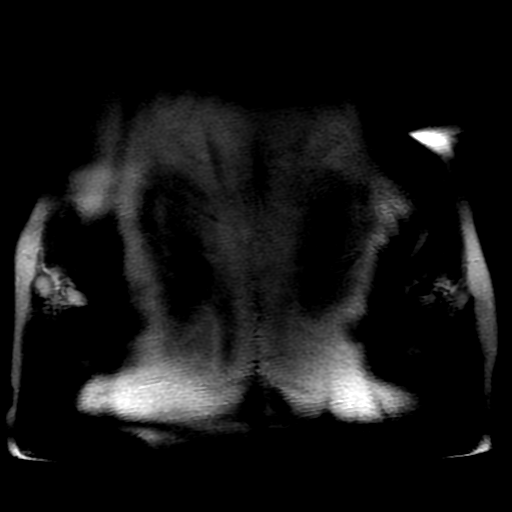

[Series 8: bSSFP · axial · 5.0mm · 0.78mm/px · z∈[+11,+286]mm · 2 of 56 slices shown]
[im 1/56]
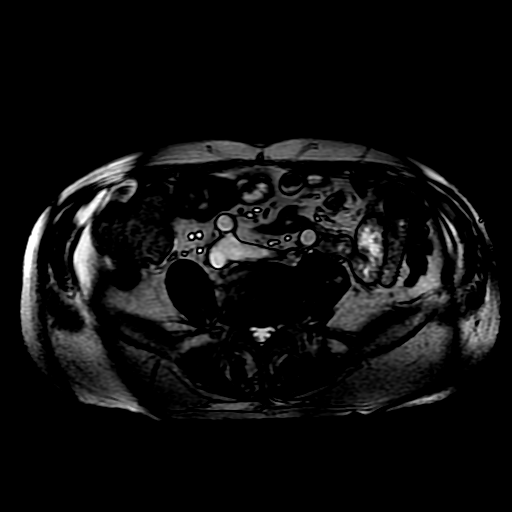
[im 56/56]
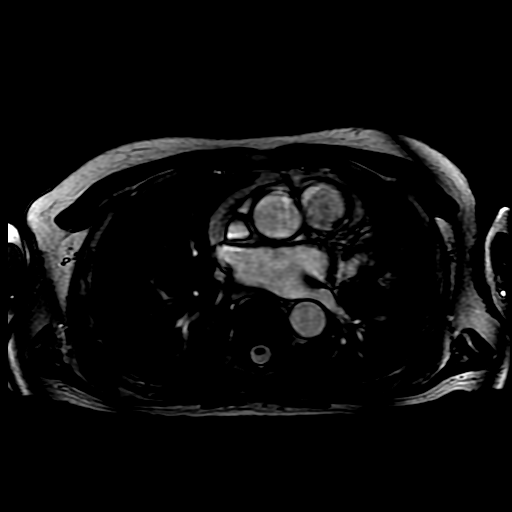

[Series 9: ax dualecho bh · axial · 5.0mm · 0.78mm/px · z∈[+11,+286]mm · 4 of 112 slices shown]
[im 1/112]
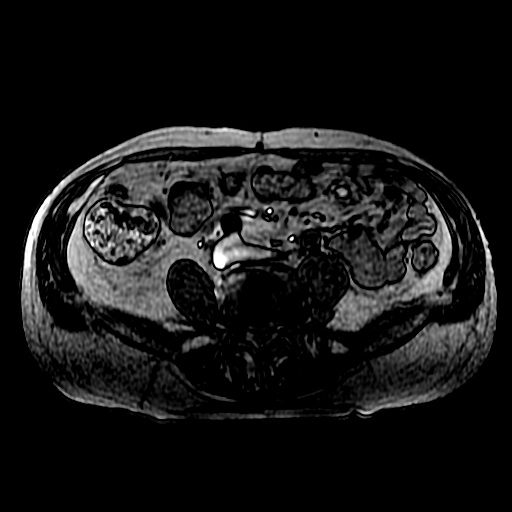
[im 38/112]
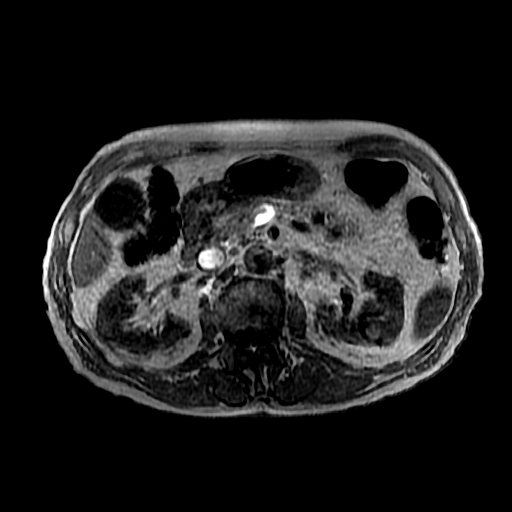
[im 75/112]
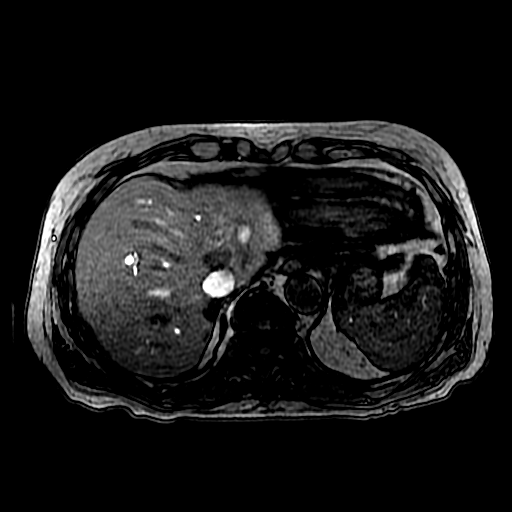
[im 112/112]
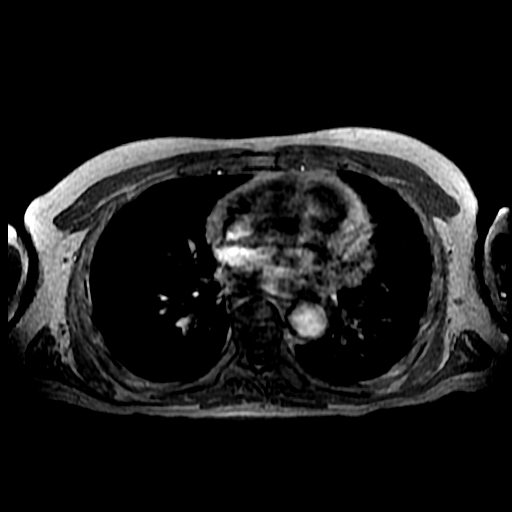

[Series 400: DWI · axial · 6.0mm · 1.56mm/px · 1 of 36 slices shown]
[im 1/36]
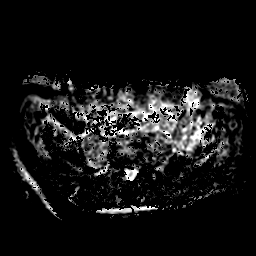

[Series 1000: T1 dynamic · axial · 5.8mm · 0.78mm/px · z∈[+23,+299]mm · 3 of 96 slices shown (1 of 2)]
[im 1/96]
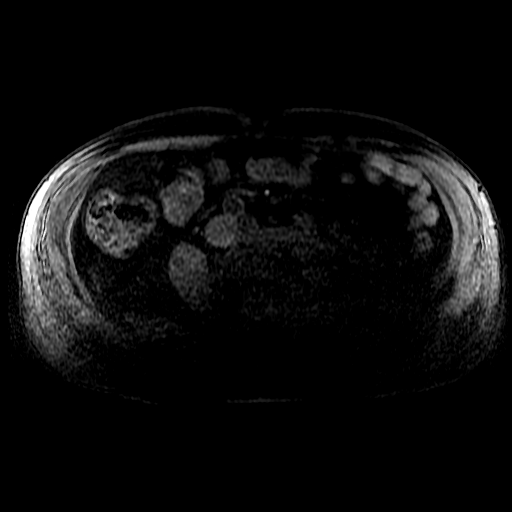
[im 48/96]
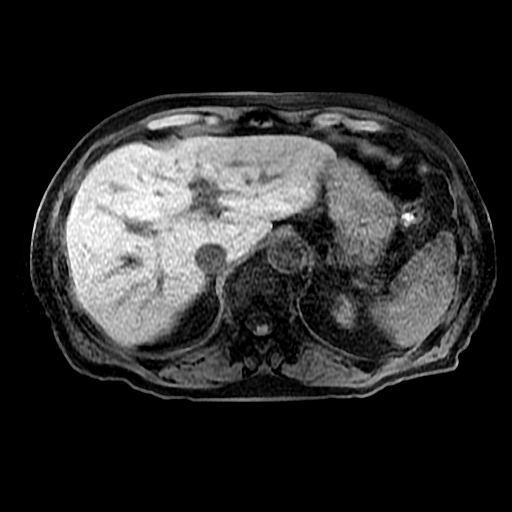
[im 96/96]
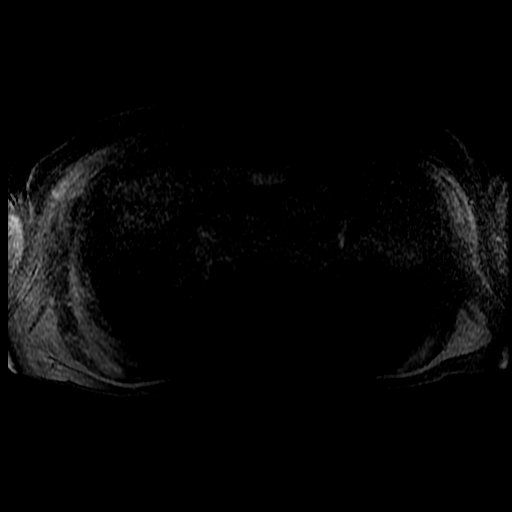

[Series 1001: T1 dynamic · axial · 5.8mm · 0.78mm/px · z∈[+23,+159]mm · 2 of 96 slices shown (2 of 2)]
[im 1/96]
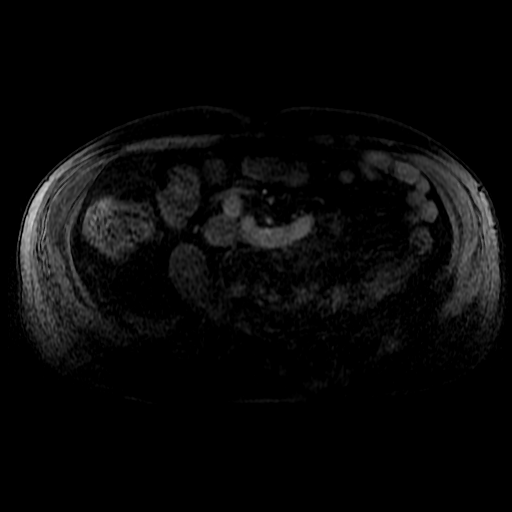
[im 48/96]
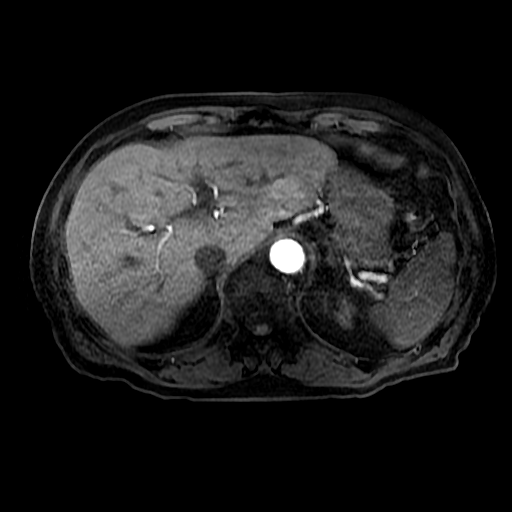

[20 of 48 positions shown; findings below may reference images not displayed]

FINDINGS: COMBINED FINDINGS FOR BOTH MR ABDOMEN AND PELVIS

Despite efforts by the technologist and patient, motion artifact is
present on today's exam and could not be eliminated. This reduces
exam sensitivity and specificity.

Lower chest: Mild cardiomegaly.

Hepatobiliary: Unremarkable

Pancreas:  Unremarkable

Spleen:  Unremarkable

Adrenals/Urinary Tract:  The adrenal glands appear unremarkable.

In the left mid kidney a 4.0 by 3.6 by 4.4 cm solid enhancing mass
has mixed T2 signal characteristics and mixed T1 signal
characteristics. No internal fat density within this lesion on prior
CT or on today's MRI. No appreciable tumor thrombus in the left
renal vein.

Numerous small additional cysts of varying complexity are scattered
throughout both kidneys. Some of these have high precontrast T1
signal characteristics and many are technically too small to
characterize, but no other specific worrisome enhancing lesions are
identified. Metal artifact in the vicinity of the small caliber
urinary bladder, correlate with operative history.

Stomach/Bowel: Descending and sigmoid colon diverticulosis.

Vascular/Lymphatic: Aortoiliac atherosclerotic vascular disease. No
pathologic adenopathy is identified.

Reproductive: Prostatectomy. Urethral sphincter implant noted with
reservoir in the right lower quadrant.

Other:  No supplemental non-categorized findings.

Musculoskeletal: An enhancing right iliac bone lesion measuring
cm in diameter on image [DATE] previously measured 1.3 cm in diameter
on 08/04/2016. Enhancing 4.8 by 1.9 cm left sacral T2 hyperintense
lesion on image [DATE] appears roughly stable. An enhancing 2.0 by
cm T2 hyperintense lesion of the left proximal femur on image 36/6
of the pelvic MRI previously measured 1.9 by 1.4 cm.

Small cystic lesions along the margins of the femoral heads noted.

Dextroconvex lumbar scoliosis with spondylosis and degenerative disc
disease. Degenerative endplate findings eccentric to the left at the
L2-3 level.
IMPRESSION: 1. A 4.4 cm solid enhancing left mid kidney renal mass is likely a
renal cell carcinoma. No tumor thrombus in the left renal vein or
appreciable adenopathy.
2. Enhancing bony pelvic lesions are roughly stable in size compared
to the pelvic MRI of 08/04/2016, and may represent residua of prior
metastatic disease; given the prostatectomy, previous prostate
cancer lesions are suggested. The lack of significant progression
may indicate effectively treated disease, but these lesions do
continue to mildly enhance.
3. Prostatectomy noted with urethral sphincter implant.
4. Mild cardiomegaly.
5. Numerous renal cysts of varying size and complexity, but without
definite enhancement. Many of these are technically too small to
characterize.
6. Descending and sigmoid colon diverticulosis.
7.  Aortic Atherosclerosis ([id number].I).
8. Lumbar spondylosis and degenerative disc disease.
